# Patient Record
Sex: Female | Born: 1960 | Race: White | Hispanic: No | Marital: Single | Smoking: Former smoker
Health system: Southern US, Community
[De-identification: ages and names within clinical notes are randomized; demographics above are authoritative.]

## PROBLEM LIST (undated history)

## (undated) DIAGNOSIS — Z460 Encounter for fitting and adjustment of spectacles and contact lenses: Secondary | ICD-10-CM

## (undated) HISTORY — PX: CRANIOTOMY FOR TUMOR: SUR345

## (undated) HISTORY — PX: DILATION AND CURETTAGE OF UTERUS: SHX78

## (undated) HISTORY — PX: COLONOSCOPY: SHX174

---

## 2004-07-02 ENCOUNTER — Other Ambulatory Visit: Admission: RE | Admit: 2004-07-02 | Discharge: 2004-07-02 | Payer: Self-pay | Admitting: Obstetrics and Gynecology

## 2004-07-31 ENCOUNTER — Encounter: Admission: RE | Admit: 2004-07-31 | Discharge: 2004-07-31 | Payer: Self-pay | Admitting: Obstetrics and Gynecology

## 2004-11-03 ENCOUNTER — Emergency Department (HOSPITAL_COMMUNITY): Admission: EM | Admit: 2004-11-03 | Discharge: 2004-11-03 | Payer: Self-pay | Admitting: Family Medicine

## 2005-08-03 ENCOUNTER — Encounter: Admission: RE | Admit: 2005-08-03 | Discharge: 2005-08-03 | Payer: Self-pay | Admitting: Obstetrics and Gynecology

## 2006-04-13 ENCOUNTER — Encounter: Admission: RE | Admit: 2006-04-13 | Discharge: 2006-04-13 | Payer: Self-pay | Admitting: *Deleted

## 2006-08-10 ENCOUNTER — Encounter: Admission: RE | Admit: 2006-08-10 | Discharge: 2006-08-10 | Payer: Self-pay | Admitting: Obstetrics and Gynecology

## 2007-10-11 ENCOUNTER — Encounter: Admission: RE | Admit: 2007-10-11 | Discharge: 2007-10-11 | Payer: Self-pay | Admitting: Obstetrics and Gynecology

## 2008-03-15 ENCOUNTER — Ambulatory Visit: Payer: Self-pay | Admitting: Sports Medicine

## 2008-03-15 DIAGNOSIS — R002 Palpitations: Secondary | ICD-10-CM | POA: Insufficient documentation

## 2008-03-15 DIAGNOSIS — M21619 Bunion of unspecified foot: Secondary | ICD-10-CM | POA: Insufficient documentation

## 2008-03-15 DIAGNOSIS — M25569 Pain in unspecified knee: Secondary | ICD-10-CM | POA: Insufficient documentation

## 2008-03-15 DIAGNOSIS — M216X9 Other acquired deformities of unspecified foot: Secondary | ICD-10-CM | POA: Insufficient documentation

## 2008-11-06 ENCOUNTER — Encounter: Admission: RE | Admit: 2008-11-06 | Discharge: 2008-11-06 | Payer: Self-pay | Admitting: Obstetrics and Gynecology

## 2010-01-27 ENCOUNTER — Encounter: Admission: RE | Admit: 2010-01-27 | Discharge: 2010-01-27 | Payer: Self-pay | Admitting: Obstetrics and Gynecology

## 2010-10-02 ENCOUNTER — Other Ambulatory Visit: Payer: Self-pay | Admitting: Family Medicine

## 2010-10-02 NOTE — Telephone Encounter (Signed)
Dr.Knapp, Refill request in the pool. Patient never seen here, no appointment scheduled.

## 2011-01-02 ENCOUNTER — Other Ambulatory Visit: Payer: Self-pay | Admitting: Obstetrics and Gynecology

## 2011-01-02 DIAGNOSIS — Z1231 Encounter for screening mammogram for malignant neoplasm of breast: Secondary | ICD-10-CM

## 2011-02-10 ENCOUNTER — Ambulatory Visit: Payer: Self-pay

## 2011-02-16 ENCOUNTER — Ambulatory Visit: Payer: Self-pay

## 2011-02-26 ENCOUNTER — Ambulatory Visit
Admission: RE | Admit: 2011-02-26 | Discharge: 2011-02-26 | Disposition: A | Discharge status: Home / Self Care | Payer: BC Managed Care – PPO | Source: Ambulatory Visit | Attending: Obstetrics and Gynecology | Admitting: Obstetrics and Gynecology

## 2011-02-26 DIAGNOSIS — Z1231 Encounter for screening mammogram for malignant neoplasm of breast: Secondary | ICD-10-CM

## 2012-01-27 ENCOUNTER — Other Ambulatory Visit: Payer: Self-pay | Admitting: Obstetrics and Gynecology

## 2012-01-27 DIAGNOSIS — Z1231 Encounter for screening mammogram for malignant neoplasm of breast: Secondary | ICD-10-CM

## 2012-03-15 ENCOUNTER — Ambulatory Visit
Admission: RE | Admit: 2012-03-15 | Discharge: 2012-03-15 | Disposition: A | Discharge status: Home / Self Care | Payer: BC Managed Care – PPO | Source: Ambulatory Visit | Attending: Obstetrics and Gynecology | Admitting: Obstetrics and Gynecology

## 2012-03-15 DIAGNOSIS — Z1231 Encounter for screening mammogram for malignant neoplasm of breast: Secondary | ICD-10-CM

## 2012-05-17 ENCOUNTER — Ambulatory Visit (INDEPENDENT_AMBULATORY_CARE_PROVIDER_SITE_OTHER): Payer: BC Managed Care – PPO | Admitting: Sports Medicine

## 2012-05-17 ENCOUNTER — Encounter: Payer: Self-pay | Admitting: Sports Medicine

## 2012-05-17 VITALS — BP 117/82 | HR 67 | Ht 68.0 in | Wt 190.0 lb

## 2012-05-17 DIAGNOSIS — M7710 Lateral epicondylitis, unspecified elbow: Secondary | ICD-10-CM

## 2012-05-17 DIAGNOSIS — M25562 Pain in left knee: Secondary | ICD-10-CM | POA: Insufficient documentation

## 2012-05-17 DIAGNOSIS — M25521 Pain in right elbow: Secondary | ICD-10-CM | POA: Insufficient documentation

## 2012-05-17 DIAGNOSIS — M7711 Lateral epicondylitis, right elbow: Secondary | ICD-10-CM | POA: Insufficient documentation

## 2012-05-17 DIAGNOSIS — M25529 Pain in unspecified elbow: Secondary | ICD-10-CM

## 2012-05-17 DIAGNOSIS — M25569 Pain in unspecified knee: Secondary | ICD-10-CM

## 2012-05-17 NOTE — Progress Notes (Signed)
  Subjective:    Patient ID: Toni Thompson, female    DOB: 1961/01/11, 52 y.o.   MRN: [id number]  HPI  52 year old female presents with pain RT elbow and RT shoulder. She says 2.5 weeks ago during practice, she was practicing slicing ball. Last week, she developed RT elbow and shoulder pain while playing with her friends. She was unable to play anymore due to pain. She plays tennis 3 days per week, mostly on weekends. Pain is located lateral aspect of elbow and anterior shoulder. Has not played in 2 weeks and is very frustrated.  Last week, she went to [facility] for this issue- was given Prednisone pack (12 day taper), topical analgesic, and Mobic. She denies any weakness of RT UE, numbness or tingling.  Prednisone did lessen the pain.  Of note, patient also has torn LT lateral meniscus which was an incidental finding.  Confirmed on MRI Denies any knee pain or complaints.  Review of Systems Per HPI    Objective:   Physical Exam General: pleasant, in no acute distress  RT Shoulder: Inspection reveals no abnormalities, atrophy or asymmetry. Palpation is normal with no tenderness over AC joint or bicipital groove. ROM is full in all planes. Rotator cuff strength normal throughout. No signs of impingement with negative Hawkin's tests, empty can.  RT elbow:  Full flexion and extension. Full pronation and supination with mild pain of lateral epicondyle. Mild tenderness on palpation of lateral epicondyle. Mild tenderness with resistance of wrist extensors.  US Findings: LT elbow - hypoechoic area suggesting fluid and inflammation surrounding lateral epicondyle with incidental bone spur.  LT shoulder: Rotator cuff tendons were unremarkable.  Biceps tendon unremarkable.    Note scan of left lateral meniscus does reveal a horizontal tear with mild hypoechoic change around joint line    Assessment & Plan:

## 2012-05-17 NOTE — Assessment & Plan Note (Signed)
Will use compression sleeve  HEP

## 2012-05-17 NOTE — Patient Instructions (Addendum)
You have "tennis elbow" or lateral epicondylitis. Please wear compression sleeve on elbow and knee while playing tennis. Use the topical cream and Mobic as needed for pain. Schedule follow up appointment with Dr. Darrick Penna in 4-6 weeks or sooner as needed.

## 2012-05-17 NOTE — Assessment & Plan Note (Addendum)
Secondary to repetitive slicing during tennis lessons/games.  [place] did show inflammation and fluid surrounding lateral epicondyle.  Will give patient compression sleeve to be worn during tennis.  HEP for tennis elbow discussed.  Mobic and Voltaren gel as needed for pain.  Recheck in 4-6 weeks or sooner as needed.  Hx of migraine aura - will not use NTG

## 2012-05-17 NOTE — Assessment & Plan Note (Signed)
She clearly has a split menisucs  I suggested using compression sleeve  HEP given

## 2012-05-25 ENCOUNTER — Ambulatory Visit: Payer: BC Managed Care – PPO | Admitting: Sports Medicine

## 2012-06-22 ENCOUNTER — Encounter: Payer: Self-pay | Admitting: Sports Medicine

## 2012-06-22 ENCOUNTER — Ambulatory Visit (INDEPENDENT_AMBULATORY_CARE_PROVIDER_SITE_OTHER): Payer: BC Managed Care – PPO | Admitting: Sports Medicine

## 2012-06-22 VITALS — BP 108/73 | Ht 69.0 in | Wt 180.0 lb

## 2012-06-22 DIAGNOSIS — M25569 Pain in unspecified knee: Secondary | ICD-10-CM

## 2012-06-22 DIAGNOSIS — M7711 Lateral epicondylitis, right elbow: Secondary | ICD-10-CM

## 2012-06-22 DIAGNOSIS — M25562 Pain in left knee: Secondary | ICD-10-CM

## 2012-06-22 DIAGNOSIS — M7710 Lateral epicondylitis, unspecified elbow: Secondary | ICD-10-CM

## 2012-06-22 MED ORDER — NITROGLYCERIN 0.2 MG/HR TD PT24: MEDICATED_PATCH | TRANSDERMAL | Status: DC

## 2012-06-22 NOTE — Assessment & Plan Note (Signed)
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 Patient is having minimal pain on the lateral joint line but a negative McMurray's. Patient will be sent to formal physical therapy for this as well but I think this is more important for avoiding recurrence. Patient will follow up on an as-needed basis for this problem.

## 2012-06-22 NOTE — Progress Notes (Signed)
Patient is here for f/u of right elbow and left knee   Right elbow has made minimal improvement. Patient continues to take the meloxciam, pennsaid and compression with mild improvement as well.  Patient though does notice more pain going down the elbow as well and has to stop during the tennis match.  Patient denies any nighttime awakenings at this time.  No other new symptoms.   Left knee is doing much better, no pain, continue to wear compression with exercise, no swelling, able to do all activities.  Patient though still states it is not quite the same as he other knee.   Past medical history, social, surgical and family history all reviewed.   Physical Exam Blood pressure 108/73, height 5\' 9"  (1.753 m), weight 180 lb (81.647 kg). General: No apparent distress alert and oriented x3 mood and affect normal Respiratory: Patient's speak in full sentences and does not appear short of breath Skin: Warm dry intact with no signs of infection or rash Neuro: Cranial nerves II through XII are intact, neurovascularly intact in all extremities with 2+ DTRs and 2+ pulses. Right elbow:  Pain with palpation over lateral epicodylitis and mid forearm over PIN. + pain with wrist extension and extension of third finger.  Left knee exam: On inspection there is no gross deformity or effusion. Patient has full range of motion. She is minimally tender over the lateral joint line. Patient has a negative McMurray's as well as a negative Thessaly.

## 2012-06-22 NOTE — Patient Instructions (Addendum)
Good to see you I am going to send you to physical therapy to help your elbow.  They will be calling you in the next day or two.  Call us on Friday if you have not heard from anyone. Continue your exercises as well on a regular basis.  We will keep injections in our back pocket but we would like to avoid it due to poor long term outcome.  We will try the nitro patch.  Try a 1/4 patch to the elbow daily but if it gives you an aura stop the medication.  Icing will still be your best friend as well.  Come back and see [place] again in 6 weeks. We will re scan you at that time and make sure you are improving.

## 2012-06-22 NOTE — Assessment & Plan Note (Signed)
Patient continues to have problems but it's not doing exercises on a regular basis. Encourage her to do this daily. We will get patient into formal physical therapy as well. She'll continue with the compression. At this point to we will actually have patient try to nitroglycerin patch. Patient states that her migraines or more of an aura and no true pain. Patient knows that this increase his frequency to stop the nitroglycerin patch immediately. Patient will try these interventions and come back again in 4-6 weeks for further evaluation.

## 2012-07-27 ENCOUNTER — Ambulatory Visit (INDEPENDENT_AMBULATORY_CARE_PROVIDER_SITE_OTHER): Payer: BC Managed Care – PPO | Admitting: Sports Medicine

## 2012-07-27 VITALS — BP 116/79 | Ht 69.0 in | Wt 180.0 lb

## 2012-07-27 DIAGNOSIS — M7711 Lateral epicondylitis, right elbow: Secondary | ICD-10-CM

## 2012-07-27 DIAGNOSIS — M25569 Pain in unspecified knee: Secondary | ICD-10-CM

## 2012-07-27 DIAGNOSIS — L259 Unspecified contact dermatitis, unspecified cause: Secondary | ICD-10-CM | POA: Insufficient documentation

## 2012-07-27 DIAGNOSIS — M25562 Pain in left knee: Secondary | ICD-10-CM

## 2012-07-27 DIAGNOSIS — M7710 Lateral epicondylitis, unspecified elbow: Secondary | ICD-10-CM

## 2012-07-27 MED ORDER — TRIAMCINOLONE ACETONIDE 0.5 % EX CREA: TOPICAL_CREAM | Freq: Two times a day (BID) | CUTANEOUS | Status: DC

## 2012-07-27 MED ORDER — HYDROXYZINE HCL 25 MG PO TABS: 25.0000 mg | ORAL_TABLET | Freq: Three times a day (TID) | ORAL | Status: DC | PRN

## 2012-07-27 MED ORDER — MELOXICAM 7.5 MG PO TABS: 7.5000 mg | ORAL_TABLET | Freq: Every day | ORAL | Status: DC | PRN

## 2012-07-27 NOTE — Patient Instructions (Addendum)
I am sorry about the rash.  It likely is from the body helix or from the sweating underneath it. You can try the new brace we are giving you today and see if it helps. Triamcinolone cream 2 times daily Hydroxyzine for the itching.  Can take it 3 times a day but may make you a little sleepy.  Continue to go to Physical therapy as well.  I think it is really beneficial.  Continue home exercises at least 3 times a week.  Potentially start working on strength of the right shoulder as well.  The flex bar is a good exercise as well.  The knee looks like it is doing really good.  Keep it up!

## 2012-07-27 NOTE — Progress Notes (Signed)
CC: Rash on right upper arm  HPI: 10 days rash of the right elbow. Patient has been wearing the body helix, she's been doing I am diaphoresis in physical therapy, has been putting on topical medication that she got from [facility] says she does not know what caused it. Patient denies any new lotions perfumes. Patient denies any new detergent. Patient has been washing her body helix a regular basis. Patient denies any fevers or chills denies any pain does have significant of itchiness in the area.  No bug bites.   Regarding her right lateral epicondylitis she is approximately 60-70% better than last visit. She has been doing very well and only has pain with her forehand while playing tennis. Patient also notices that she seems to fatigue in her serve seems to worsen throughout the first set.  No new symptoms, has not been doing the nitro patch at this time.   Patient's left knee pain with questionable meniscal injury seems to be improving significantly. Patient is having minimal pain only with stopping and twisting motions. Overall she is able do all activities of daily living. Patient denies any swelling, denies any radiation or any numbness of the lower extremity.  Physical exam Blood pressure 116/79, height 5\' 9"  (1.753 m), weight 180 lb (81.647 kg). General: No apparent distress alert and oriented x3 Right upper extremity: On inspection patient does have a macular slightly papular rash with mild will formation that appears to be contracted dermatitis in the corresponding area where a body helix to be worn. She is neurovascularly intact distally. No true effusion noted. Patient does have full range of the right elbow. She is still tender to palpation over the lateral epicondylitis region. Patient though does have improved strength with resisted extension of the wrist. Patient does have a mild positive speed and Yergason's.  Left knee exam: On inspection no gross deformity. Patient has full  range of motion and is nontender to palpation. All ligaments appear to be intact and a improved McMurray's.  Musculoskeletal ultrasound was performed and interpreted by me today. Patient's ultrasound of the right upper extremity does show that patient has healing of the lateral epicondylitis with decreasing hypoechoic changes as well as reabsorption of the bone spur that was seen previously. Patient does still have neovascularization in the area. Note true tear appreciated. In addition to this patient bicep tendon was visualized with no hypoechoic changes and no tearing. Patient does have a very shallow bicipital

## 2012-07-27 NOTE — Assessment & Plan Note (Signed)
Will do triamcinolone and hydroxizine for symptom relief. Stop the body helix for now.  Likely more due to the sweating as well. Discuss toweling off every 30 minutes if start wearing again.

## 2012-07-27 NOTE — Assessment & Plan Note (Signed)
Appears to be well healed at this time. Discussed continue eccentrics and some plyo but still avoid cutting too much.

## 2012-07-27 NOTE — Assessment & Plan Note (Signed)
Will attempt a new brace to see if it will help Appears patient is doing better as well.  Discussed some routine tennis stroke exercises and technique.  Continue PT and HEP.  Will have her come back again in 6 weeks.

## 2012-09-07 ENCOUNTER — Encounter: Payer: Self-pay | Admitting: Sports Medicine

## 2012-09-07 ENCOUNTER — Ambulatory Visit (INDEPENDENT_AMBULATORY_CARE_PROVIDER_SITE_OTHER): Payer: BC Managed Care – PPO | Admitting: Sports Medicine

## 2012-09-07 VITALS — BP 111/73 | Ht 69.0 in | Wt 180.0 lb

## 2012-09-07 DIAGNOSIS — M21619 Bunion of unspecified foot: Secondary | ICD-10-CM

## 2012-09-07 DIAGNOSIS — M2010 Hallux valgus (acquired), unspecified foot: Secondary | ICD-10-CM

## 2012-09-07 DIAGNOSIS — M7711 Lateral epicondylitis, right elbow: Secondary | ICD-10-CM

## 2012-09-07 DIAGNOSIS — M25569 Pain in unspecified knee: Secondary | ICD-10-CM

## 2012-09-07 DIAGNOSIS — M2021 Hallux rigidus, right foot: Secondary | ICD-10-CM

## 2012-09-07 DIAGNOSIS — M2020 Hallux rigidus, unspecified foot: Secondary | ICD-10-CM

## 2012-09-07 DIAGNOSIS — M7710 Lateral epicondylitis, unspecified elbow: Secondary | ICD-10-CM

## 2012-09-07 DIAGNOSIS — M25562 Pain in left knee: Secondary | ICD-10-CM

## 2012-09-07 NOTE — Progress Notes (Signed)
CC:  F/u right elbow pain, and new problem of right great toe pain  HPI: Patient presents for f/u of right elbow pain. States pain is unchanged from last visit, but improved from initial presentation. She is using the body helix compression sleeve and feels it helps. Rash resolved and no further problems with sleeve. Patient thinks the rash was from the cream she put under the sleeve and not the sleeve itself. Forearm pain still bothers her every day, especially on volley and slice. She is still playing tennis. She also has discomfort at work when using the computer  Also complains of right great toe pain. Toe is chronically stiff, but more inflamed during the spring after playing a lot of tennis nearly daily for 3 weeks. Saw [facility] for inserts, but this has not really helped much. She thinks.  ROS: As above in the HPI. All other systems are stable or negative.  OBJECTIVE: APPEARANCE:  Patient in no acute distress.The patient appeared well nourished and normally developed. HEENT: No scleral icterus. Conjunctiva non-injected MSK:  1. Right arm: No obvious swelling or deformity. Minimal TTP over lateral epicondyle. Mild-moderate TTP on palpation of body of extensor carpi radialis. 5/5 strength with moderate discomfort on wrist extension and lateral deviation against resistance. 2. Right great toe: There is obvious bony enlargement of right MTP joint. TTP on joint line. Bony overgrowth present. Bony hallux rigidus present with limitations in flexion and extension of great toe  Bunion of left great toe  MSK U/S: 1. Right lat epicondyle: Normal tendon fiber alignment of common extensor tendon at lateral epicondyle. No hypoechogenicity or tears present. Mild peripheral neovascularization near the musculotendinous junction. 2. Right MTP joint: There is bony overgrowth and bone spurring present with increased fluid around joint capsule. There is joint space narrowing present.  ASSESSMENT  and PLAN: 1. Right forearm pain. Lateral epicondylitis appears nearly resolved at this time. Pain seems to be more consistent with location within the extensor muscle bodies, especially extensor carpi radialis. Suspect muscle strain/overuse. - Decrease string tension on racquet - Use light weight racquet. - Increase grip to 4 and 3/8 inch - Continue home wrist extension and supination exercises  2. Bony hallux rigidus - rubber based orthotic modified today to include scaphoid pad in arch as well as 1st metatarsal ray - Use ice to MTP joint, especially after tennis - Try topical NSAID and capsaicin.  She felt less toe pain after walking in these

## 2012-09-07 NOTE — Assessment & Plan Note (Signed)
See note - this has steadily improved Keep up exercises  maek changes to racquet as noted  Reck prn

## 2012-09-07 NOTE — Assessment & Plan Note (Signed)
Stable with this at present

## 2012-09-07 NOTE — Patient Instructions (Signed)
1. Forearm pain - Reduce string tension - Try increased grip size to 4 and 3/8 - Continue forearm home exercises  2. Great toe pain - Try modified insoles - Mobilization in PT unlikely to help due to arthritis and bone spurs - Try topical NSAID and capsaicin - Ice for 20 min 2-3 x per day, especially after activity

## 2012-09-07 NOTE — Assessment & Plan Note (Signed)
Associated with significant hallux rigidus  We will try modifying current orthotics  If that works well continue with this  If not consider new customized pair with first ray and long arch support

## 2012-10-25 ENCOUNTER — Encounter: Payer: Self-pay | Admitting: *Deleted

## 2013-01-05 ENCOUNTER — Other Ambulatory Visit: Payer: Self-pay

## 2013-01-10 ENCOUNTER — Encounter (HOSPITAL_BASED_OUTPATIENT_CLINIC_OR_DEPARTMENT_OTHER): Payer: Self-pay | Admitting: *Deleted

## 2013-01-10 NOTE — Progress Notes (Signed)
No labs needed

## 2013-01-11 ENCOUNTER — Other Ambulatory Visit: Payer: Self-pay | Admitting: Orthopedic Surgery

## 2013-01-11 NOTE — H&P (Signed)
Toni Thompson/[facility] [address]   [address] [place], [place] [address] [phone] A Division of [facility]  Toni Thompson, M.D.   Toni Thompson, M.D.   Toni Thompson, M.D.   Toni Thompson, M.D.   Toni Thompson, M.D Toni Thompson. Toni Thompson, M.D.  Toni Thompson, M.D.  Toni Thompson, M.D.  Toni Thompson, M.D.   Toni Thompson, M.D. Toni Thompson. Toni Rough, PA-C  Toni A. Shepperson, PA-C  Toni Hilton, PA-C [place], [place]   RE: Toni, Thompson   [id number]      DOB: 13-Dec-1960 PROGRESS NOTE: 12-27-12 Toni Thompson is seen for a second opinion and treatment recommendation for issues with her  right foot great toe.  Good historian and I have some of her old records but I do not have them in their entirety.    A long history of hallus rigidus, loss of motion, degenerative arthritis, first MTP joint right foot great toe.  Seen and evaluated in the past by Dr. Lestine Box.  Also seen more recently by Dr. Victorino Dike.  She is relating to me that she has been told to just live with her symptoms and try to put off any operative intervention.  Unfortunately in trying to do this and in trying to remain active playing tennis regularly, it has become more and more of a problem.  She now has symptoms with activities of daily living.  This has altered her gait and she has developed a stress fracture of her fifth metatarsal on that same foot confirmed by MRI scan by Dr. Farris Has.  She presents now to discuss what can be done while she is taking time off from tennis and playing to help with the primary problem of her great toe.  No previous operative intervention.    Toni Thompson then went through a long diatribe with me about all of the things that have happened since she turned 51.  She reiterated time and time again that her only goal is to continue to play tennis on a regular basis numerous times per week on hard and soft courts playing singles as much as  doubles.  In the past, she has been active in working out, maintaining her weight, maintaining her conditioning but she completely stopped working out with a Systems analyst.  She has put on 25 pounds and in addition to that has continued to try to play tennis at an aggressive level.  During all of this, she has developed symptoms in her left leg with an MRI confirmation done at [facility] of some progressive degenerative arthritis, moderate at least, patellofemoral joint lateral compartment and complex tearing of her lateral meniscus.  She has also developed pronounced popping and irritation lateral aspect of her hip over the greater trochanter iliotibial band.    I have looked at the MRI report that was done of her knee that she had on her phone confirming what is described above.  She reiterated to me numerous times that nobody ever told her to stop playing tennis, and as best she can recall that nothing needed to be done to help her knee or her hip until she decided they were Thompson enough to require treatment.  She has relatively pronounced mechanical symptoms lateral hip from popping the IT band as well as intraarticular mechanical symptoms in her knee but again they have not stopped her for continuing to play tennis.  It is the issue with her right foot that is  causing her to stop.    The remaining history was reviewed, updated and included in the chart.    EXAMINATION: General exam is outlined and included in the chart.  Specifically, 52 year old female, 5'9", 200 pounds.  She has a relatively antalgic gait on the right as a result of los of motion of the first MP joint as well as antalgic gait from the stress fracture on the lateral side of her foot.  On upper extremity exam, she has relatively good motion, strength and function but although she did not mention it previously, she has findings of at least moderate lateral epicondylitis in the right elbow.  No instability.  Lower extremity  exam: she has snapping iliotibial band greater trochanteric bursitis   Continued----  Toni Thompson/[facility] [address]   [address] [place], [place] [address] [phone] A Division of [facility]  Toni Thompson, M.D.   Toni Thompson, M.D.   Toni Thompson, M.D.   Toni Thompson, M.D.   Toni Thompson, M.D Toni Thompson. Toni Thompson, M.D.  Toni Thompson, M.D.  Toni Thompson, M.D.  Toni Thompson, M.D.   Toni Thompson, M.D. Toni Thompson. Toni Rough, PA-C  Toni A. Shepperson, PA-C  Toni Salinas, PA-C [place], [place]   RE: Toni, Thompson   [id number]      DOB: 21-May-1960 PROGRESS NOTE: 12-27-12 Page 2   on the left.  Negative log roll both sides.  Negative straight leg raise both sides.  Left knee point tenderness laterally, positive McMurray's, some grating crepitus patellofemoral, tibiofemoral not extreme, stable ligaments.  Right foot is very tender over the fifth metatarsal.  Her first MP joint has dorsal spurring and she has dorsiflexion of maybe 15 degrees, nothing more.  Some crepitus throughout most of the joint.  No varus or valgus deformity.  No deformity of the midfoot or lesser toes.    X-RAYS: I reviewed her previous x-rays and I got a good lateral of her first MP joint right foot.  She has extensive dorsal spurring from hallus rigidus.  She has more than 50% narrowing of the entire joint but not bone on bone.  Although she has a stress fracture by MRI, I am not seeing that on plain films.    DISPOSITION: I have had a long extensive discussion of more than 45 minutes with Toni Thompson concerning her dilemma.  The reality of numerous overuse and traumatic injuries that are occurring as a result of her activity and lack of preparation of activity are extremely obvious, but despite a thorough discussion of this she keeps returning to the fact that she must play tennis regularly and at this point in time has no desire to do  any other activities in terms of training and preparation for that event.  She also does not appear to be very interested in any cross-training or other conditioning events.    1. In regards to her foot, I think a cheilectomy is reasonable.  I have emphasized to her that there are extremes of degenerative changes in the first MP joint and a cheilectomy will help with motion, help with function, buy time, but at the end of the day she could have progressive arthritis and could come to an arthrodesis in the future. I think based on my findings it is definitely worth proceeding with that procedure from what we can gain.  I have told her what to expect inter and postoperatively.  I have also  emphasized to her that she is going to be in a Cam walker for a full six weeks Thompson-op to allow healing of her 5th metatarsal stress fracture.  I am also noting that she came in today leaving her Cam walker in the car and obviously not using that.  I told her if we do a successful procedure on her first MTP joint and she does not let her stress fracture heal, that is going to be a problem in the future.  Paperwork completed.  All questions are answered.  Risks, benefits, complications and anticipated recover and rehab were reviewed with her.  2. In regards to the numerous other issues of greater trochanteric bursitis, snapping of the iliotibial band, torn lateral meniscus, degenerative arthritis, and tennis elbow she needs somewhat of a reality check.  I   Continued---  Toni Thompson/[facility] [address]   [address] [place], [place] [address] [phone] A Division of [facility]  Toni Thompson, M.D.   Toni Thompson, M.D.   Toni Thompson, M.D.   Toni Thompson, M.D.   Toni Thompson, M.D Toni Thompson. Toni Thompson, M.D.  Toni Thompson, M.D.  Toni Thompson, M.D.  Toni Thompson, M.D.   Toni Thompson, M.D. Toni Thompson. Toni Rough, PA-C  Toni A. Shepperson,  PA-C  Toni Walters, PA-C [place], [place]   RE: Toni, Thompson   [id number]      DOB: [birth year] PROGRESS NOTE: 12-27-12 Page 3  really tried to do this today and I don't know if we accomplished much.  If she is interested, I can take this up on future visits but I am just not sure that she is interested in anything other than just playing tennis as much as she can until her body will not let her do it anymore.  I think we will have to wait and see how she does after her foot.     Toni Thompson, M.D.  Electronically verified by Toni Thompson, M.D. DFM:gde D 12-27-12 T 12-28-12 Cc:  Toni Arthurs, MD fax [phone]

## 2013-01-12 ENCOUNTER — Ambulatory Visit (HOSPITAL_BASED_OUTPATIENT_CLINIC_OR_DEPARTMENT_OTHER): Payer: BC Managed Care – PPO | Admitting: Anesthesiology

## 2013-01-12 ENCOUNTER — Encounter (HOSPITAL_BASED_OUTPATIENT_CLINIC_OR_DEPARTMENT_OTHER)
Admission: RE | Disposition: A | Discharge status: Home / Self Care | Payer: Self-pay | Source: Ambulatory Visit | Attending: Orthopedic Surgery

## 2013-01-12 ENCOUNTER — Encounter (HOSPITAL_BASED_OUTPATIENT_CLINIC_OR_DEPARTMENT_OTHER): Payer: Self-pay | Admitting: Anesthesiology

## 2013-01-12 ENCOUNTER — Encounter (HOSPITAL_BASED_OUTPATIENT_CLINIC_OR_DEPARTMENT_OTHER): Payer: BC Managed Care – PPO | Admitting: Anesthesiology

## 2013-01-12 ENCOUNTER — Ambulatory Visit (HOSPITAL_BASED_OUTPATIENT_CLINIC_OR_DEPARTMENT_OTHER)
Admission: RE | Admit: 2013-01-12 | Discharge: 2013-01-12 | Disposition: A | Discharge status: Home / Self Care | Payer: BC Managed Care – PPO | Source: Ambulatory Visit | Attending: Orthopedic Surgery | Admitting: Orthopedic Surgery

## 2013-01-12 DIAGNOSIS — M2020 Hallux rigidus, unspecified foot: Secondary | ICD-10-CM | POA: Insufficient documentation

## 2013-01-12 DIAGNOSIS — M19079 Primary osteoarthritis, unspecified ankle and foot: Secondary | ICD-10-CM | POA: Insufficient documentation

## 2013-01-12 HISTORY — DX: Encounter for fitting and adjustment of spectacles and contact lenses: Z46.0

## 2013-01-12 HISTORY — PX: CHEILECTOMY: SHX1336

## 2013-01-12 LAB — POCT HEMOGLOBIN-HEMACUE: Hemoglobin: 14.3 g/dL (ref 12.0–15.0)

## 2013-01-12 SURGERY — CHEILECTOMY
Anesthesia: General | Site: Toe | Laterality: Right | Wound class: Clean

## 2013-01-12 MED ORDER — FENTANYL CITRATE 0.05 MG/ML IJ SOLN
50.0000 ug | INTRAMUSCULAR | Status: DC | PRN
  Administered 2013-01-12: 100 ug via INTRAVENOUS

## 2013-01-12 MED ORDER — PROPOFOL 10 MG/ML IV BOLUS
INTRAVENOUS | Status: DC | PRN
  Administered 2013-01-12: 200 mg via INTRAVENOUS

## 2013-01-12 MED ORDER — LACTATED RINGERS IV SOLN
INTRAVENOUS | Status: DC
  Administered 2013-01-12 (×2): via INTRAVENOUS

## 2013-01-12 MED ORDER — MIDAZOLAM HCL 2 MG/2ML IJ SOLN
INTRAMUSCULAR | Status: AC
  Filled 2013-01-12: qty 2

## 2013-01-12 MED ORDER — DEXAMETHASONE SODIUM PHOSPHATE 4 MG/ML IJ SOLN
INTRAMUSCULAR | Status: DC | PRN
  Administered 2013-01-12: 10 mg via INTRAVENOUS

## 2013-01-12 MED ORDER — BUPIVACAINE HCL (PF) 0.25 % IJ SOLN
INTRAMUSCULAR | Status: AC
  Filled 2013-01-12: qty 30

## 2013-01-12 MED ORDER — OXYCODONE-ACETAMINOPHEN 5-325 MG PO TABS: 1.0000 | ORAL_TABLET | ORAL | Status: DC | PRN

## 2013-01-12 MED ORDER — BUPIVACAINE HCL (PF) 0.5 % IJ SOLN
INTRAMUSCULAR | Status: AC
  Filled 2013-01-12: qty 30

## 2013-01-12 MED ORDER — DEXAMETHASONE SODIUM PHOSPHATE 10 MG/ML IJ SOLN
INTRAMUSCULAR | Status: DC | PRN
  Administered 2013-01-12: 6 mg
  Administered 2013-01-12: 10 mg via INTRAVENOUS

## 2013-01-12 MED ORDER — HYDROMORPHONE HCL PF 1 MG/ML IJ SOLN: 0.2500 mg | INTRAMUSCULAR | Status: DC | PRN

## 2013-01-12 MED ORDER — PROMETHAZINE HCL 25 MG/ML IJ SOLN: 6.2500 mg | INTRAMUSCULAR | Status: DC | PRN

## 2013-01-12 MED ORDER — BUPIVACAINE-EPINEPHRINE PF 0.5-1:200000 % IJ SOLN
INTRAMUSCULAR | Status: DC | PRN
  Administered 2013-01-12: 150 mg

## 2013-01-12 MED ORDER — FENTANYL CITRATE 0.05 MG/ML IJ SOLN
INTRAMUSCULAR | Status: AC
  Filled 2013-01-12: qty 2

## 2013-01-12 MED ORDER — MIDAZOLAM HCL 5 MG/5ML IJ SOLN
INTRAMUSCULAR | Status: DC | PRN
  Administered 2013-01-12: 2 mg via INTRAVENOUS

## 2013-01-12 MED ORDER — CEFAZOLIN SODIUM-DEXTROSE 2-3 GM-% IV SOLR
2.0000 g | INTRAVENOUS | Status: AC
  Administered 2013-01-12: 2 g via INTRAVENOUS

## 2013-01-12 MED ORDER — CEFAZOLIN SODIUM-DEXTROSE 2-3 GM-% IV SOLR
INTRAVENOUS | Status: AC
  Filled 2013-01-12: qty 50

## 2013-01-12 MED ORDER — OXYCODONE HCL 5 MG PO TABS: 5.0000 mg | ORAL_TABLET | Freq: Once | ORAL | Status: DC | PRN

## 2013-01-12 MED ORDER — FENTANYL CITRATE 0.05 MG/ML IJ SOLN
INTRAMUSCULAR | Status: DC | PRN
  Administered 2013-01-12: 50 ug via INTRAVENOUS

## 2013-01-12 MED ORDER — FENTANYL CITRATE 0.05 MG/ML IJ SOLN
INTRAMUSCULAR | Status: AC
  Filled 2013-01-12: qty 4

## 2013-01-12 MED ORDER — MIDAZOLAM HCL 2 MG/2ML IJ SOLN
1.0000 mg | INTRAMUSCULAR | Status: DC | PRN
  Administered 2013-01-12: 2 mg via INTRAVENOUS

## 2013-01-12 MED ORDER — OXYCODONE HCL 5 MG/5ML PO SOLN: 5.0000 mg | Freq: Once | ORAL | Status: DC | PRN

## 2013-01-12 MED ORDER — ONDANSETRON HCL 4 MG/2ML IJ SOLN
INTRAMUSCULAR | Status: DC | PRN
  Administered 2013-01-12: 4 mg via INTRAVENOUS

## 2013-01-12 MED ORDER — CHLORHEXIDINE GLUCONATE 4 % EX LIQD: 60.0000 mL | Freq: Once | CUTANEOUS | Status: DC

## 2013-01-12 SURGICAL SUPPLY — 63 items
APL SKNCLS STERI-STRIP NONHPOA (GAUZE/BANDAGES/DRESSINGS)
BANDAGE ELASTIC 4 VELCRO ST LF (GAUZE/BANDAGES/DRESSINGS) IMPLANT
BANDAGE ELASTIC 6 VELCRO ST LF (GAUZE/BANDAGES/DRESSINGS) IMPLANT
BANDAGE ESMARK 6X9 LF (GAUZE/BANDAGES/DRESSINGS) ×1 IMPLANT
BENZOIN TINCTURE PRP APPL 2/3 (GAUZE/BANDAGES/DRESSINGS) IMPLANT
BLADE AVERAGE 25X9 (BLADE) IMPLANT
BLADE OSC/SAG .038X5.5 CUT EDG (BLADE) ×1 IMPLANT
BLADE SURG 15 STRL LF DISP TIS (BLADE) ×1 IMPLANT
BLADE SURG 15 STRL SS (BLADE) ×2
BNDG CMPR 9X6 STRL LF SNTH (GAUZE/BANDAGES/DRESSINGS) ×1
BNDG COHESIVE 4X5 TAN STRL (GAUZE/BANDAGES/DRESSINGS) ×2 IMPLANT
BNDG ESMARK 6X9 LF (GAUZE/BANDAGES/DRESSINGS) ×2
CANISTER SUCT 1200ML W/VALVE (MISCELLANEOUS) IMPLANT
COVER TABLE BACK 60X90 (DRAPES) ×2 IMPLANT
CUFF TOURNIQUET SINGLE 18IN (TOURNIQUET CUFF) ×1 IMPLANT
CUFF TOURNIQUET SINGLE 24IN (TOURNIQUET CUFF) IMPLANT
DECANTER SPIKE VIAL GLASS SM (MISCELLANEOUS) IMPLANT
DRAPE EXTREMITY T 121X128X90 (DRAPE) ×2 IMPLANT
DRAPE OEC MINIVIEW 54X84 (DRAPES) ×1 IMPLANT
DRAPE U 20/CS (DRAPES) ×2 IMPLANT
DRAPE U-SHAPE 47X51 STRL (DRAPES) ×2 IMPLANT
DURAPREP 26ML APPLICATOR (WOUND CARE) ×2 IMPLANT
ELECT NDL TIP 2.8 STRL (NEEDLE) ×1 IMPLANT
ELECT NEEDLE TIP 2.8 STRL (NEEDLE) ×2 IMPLANT
ELECT REM PT RETURN 9FT ADLT (ELECTROSURGICAL) ×2
ELECTRODE REM PT RTRN 9FT ADLT (ELECTROSURGICAL) ×1 IMPLANT
GAUZE XEROFORM 1X8 LF (GAUZE/BANDAGES/DRESSINGS) ×2 IMPLANT
GLOVE BIO SURGEON STRL SZ8 (GLOVE) ×1 IMPLANT
GLOVE BIOGEL M STRL SZ7.5 (GLOVE) ×1 IMPLANT
GLOVE BIOGEL PI IND STRL 8 (GLOVE) IMPLANT
GLOVE BIOGEL PI IND STRL 8.5 (GLOVE) ×1 IMPLANT
GLOVE BIOGEL PI INDICATOR 8 (GLOVE) ×1
GLOVE BIOGEL PI INDICATOR 8.5 (GLOVE)
GLOVE ORTHO TXT STRL SZ7.5 (GLOVE) ×3 IMPLANT
GOWN BRE IMP PREV XXLGXLNG (GOWN DISPOSABLE) ×1 IMPLANT
GOWN PREVENTION PLUS XLARGE (GOWN DISPOSABLE) ×2 IMPLANT
GOWN PREVENTION PLUS XXLARGE (GOWN DISPOSABLE) ×1 IMPLANT
NDL HYPO 25X1 1.5 SAFETY (NEEDLE) IMPLANT
NEEDLE HYPO 25X1 1.5 SAFETY (NEEDLE) IMPLANT
NS IRRIG 1000ML POUR BTL (IV SOLUTION) ×2 IMPLANT
PACK BASIN DAY SURGERY FS (CUSTOM PROCEDURE TRAY) ×2 IMPLANT
PAD CAST 3X4 CTTN HI CHSV (CAST SUPPLIES) IMPLANT
PAD CAST 4YDX4 CTTN HI CHSV (CAST SUPPLIES) ×2 IMPLANT
PADDING CAST COTTON 3X4 STRL (CAST SUPPLIES)
PADDING CAST COTTON 4X4 STRL (CAST SUPPLIES) ×4
PENCIL BUTTON HOLSTER BLD 10FT (ELECTRODE) ×2 IMPLANT
SPONGE GAUZE 4X4 12PLY (GAUZE/BANDAGES/DRESSINGS) ×2 IMPLANT
SPONGE LAP 4X18 X RAY DECT (DISPOSABLE) IMPLANT
STOCKINETTE 4X48 STRL (DRAPES) ×2 IMPLANT
STRIP CLOSURE SKIN 1/2X4 (GAUZE/BANDAGES/DRESSINGS) ×1 IMPLANT
SUCTION FRAZIER TIP 10 FR DISP (SUCTIONS) IMPLANT
SUT ETHIBOND 2 OS 4 DA (SUTURE) IMPLANT
SUT ETHILON 3 0 PS 1 (SUTURE) ×2 IMPLANT
SUT VIC AB 2-0 SH 27 (SUTURE)
SUT VIC AB 2-0 SH 27XBRD (SUTURE) IMPLANT
SUT VIC AB 3-0 SH 27 (SUTURE) ×2
SUT VIC AB 3-0 SH 27X BRD (SUTURE) IMPLANT
SUT VICRYL 4-0 PS2 18IN ABS (SUTURE) IMPLANT
SYR BULB 3OZ (MISCELLANEOUS) ×2 IMPLANT
SYR CONTROL 10ML LL (SYRINGE) ×2 IMPLANT
TUBE CONNECTING 20X1/4 (TUBING) IMPLANT
UNDERPAD 30X30 INCONTINENT (UNDERPADS AND DIAPERS) ×2 IMPLANT
YANKAUER SUCT BULB TIP NO VENT (SUCTIONS) IMPLANT

## 2013-01-12 NOTE — Progress Notes (Signed)
Assisted Dr. Singer with right, ultrasound guided, popliteal/saphenous block. Side rails up, monitors on throughout procedure. See vital signs in flow sheet. Tolerated Procedure well. 

## 2013-01-12 NOTE — Anesthesia Procedure Notes (Addendum)
Anesthesia Regional Block:  Popliteal block  Pre-Anesthetic Checklist: ,, timeout performed, Correct Patient, Correct Site, Correct Laterality, Correct Procedure, Correct Position, site marked, Risks and benefits discussed,  Surgical consent,  Pre-op evaluation,  At surgeon's request and post-op pain management  Laterality: Right  Prep: chloraprep       Needles:  Injection technique: Single-shot  Needle Type: Echogenic Stimulator Needle          Additional Needles:  Procedures: ultrasound guided (picture in chart) and nerve stimulator Popliteal block  Nerve Stimulator or Paresthesia:  Response: plantar flexion, 0.45 mA,   Additional Responses:   Narrative:  Start time: 01/12/2013 8:31 AM End time: 01/12/2013 8:41 AM Injection made incrementally with aspirations every 5 mL.  Performed by: Personally  Anesthesiologist: J. Adonis Huguenin, MD  Additional Notes: A functioning IV was confirmed and monitors were applied.  Sterile prep and drape, hand hygiene and sterile gloves were used.  Negative aspiration and test dose prior to incremental administration of local anesthetic. The patient tolerated the procedure well.Ultrasound  guidance: relevant anatomy identified, needle position confirmed, local anesthetic spread visualized around nerve(s), vascular puncture avoided.  Image printed for medical record.   Popliteal block Procedure Name: LMA Insertion Date/Time: 01/12/2013 9:41 AM Performed by: Caren Macadam Pre-anesthesia Checklist: Patient identified, Emergency Drugs available, Suction available and Patient being monitored Patient Re-evaluated:Patient Re-evaluated prior to inductionOxygen Delivery Method: Circle System Utilized Preoxygenation: Pre-oxygenation with 100% oxygen Intubation Type: IV induction Ventilation: Mask ventilation without difficulty LMA: LMA inserted LMA Size: 4.0 Number of attempts: 1 Airway Equipment and Method: bite block Placement Confirmation:  positive ETCO2 and breath sounds checked- equal and bilateral Tube secured with: Tape Dental Injury: Teeth and Oropharynx as per pre-operative assessment

## 2013-01-12 NOTE — Anesthesia Preprocedure Evaluation (Addendum)
Anesthesia Evaluation  Patient identified by MRN, date of birth, ID band Patient awake    Reviewed: Allergy & Precautions, H&P , NPO status , Patient's Chart, lab work & pertinent test results  Airway       Dental   Pulmonary former smoker,          Cardiovascular negative cardio ROS      Neuro/Psych Hx of Craniotomy for tumor negative psych ROS   GI/Hepatic negative GI ROS, Neg liver ROS,   Endo/Other  negative endocrine ROS  Renal/GU negative Renal ROS  negative genitourinary   Musculoskeletal negative musculoskeletal ROS (+)   Abdominal   Peds negative pediatric ROS (+)  Hematology negative hematology ROS (+)   Anesthesia Other Findings   Reproductive/Obstetrics negative OB ROS                          Anesthesia Physical Anesthesia Plan  ASA: II  Anesthesia Plan: General   Post-op Pain Management:    Induction: Intravenous  Airway Management Planned: LMA  Additional Equipment:   Intra-op Plan:   Post-operative Plan: Extubation in OR  Informed Consent: I have reviewed the patients History and Physical, chart, labs and discussed the procedure including the risks, benefits and alternatives for the proposed anesthesia with the patient or authorized representative who has indicated his/her understanding and acceptance.   Dental advisory given  Plan Discussed with: CRNA, Anesthesiologist and Surgeon  Anesthesia Plan Comments:        Anesthesia Quick Evaluation

## 2013-01-12 NOTE — Anesthesia Postprocedure Evaluation (Signed)
Anesthesia Post Note  Patient: Toni Thompson  Procedure(s) Performed: Procedure(s) (LRB): RIGHT HALLUX RIGIDUS WITH CHEILECTOMY 1ST METATARSOPHALANGEAL (Right)  Anesthesia type: general  Patient location: PACU  Post pain: Pain level controlled  Post assessment: Patient's Cardiovascular Status Stable  Last Vitals:  Filed Vitals:   01/12/13 1100  BP: 109/64  Pulse: 68  Temp:   Resp: 11    Post vital signs: Reviewed and stable  Level of consciousness: sedated  Complications: No apparent anesthesia complications

## 2013-01-12 NOTE — Transfer of Care (Signed)
Immediate Anesthesia Transfer of Care Note  Patient: Toni Thompson  Procedure(s) Performed: Procedure(s): RIGHT HALLUX RIGIDUS WITH CHEILECTOMY 1ST METATARSOPHALANGEAL (Right)  Patient Location: PACU  Anesthesia Type:General and GA combined with regional for post-op pain  Level of Consciousness: awake and alert   Airway & Oxygen Therapy: Patient Spontanous Breathing and Patient connected to face mask oxygen  Post-op Assessment: Report given to PACU RN and Post -op Vital signs reviewed and stable  Post vital signs: Reviewed and stable  Complications: No apparent anesthesia complications

## 2013-01-12 NOTE — Interval H&P Note (Signed)
History and Physical Interval Note:  01/12/2013 7:24 AM  Toni Thompson  has presented today for surgery, with the diagnosis of RIGHT HALLUS RIGIDUS  The various methods of treatment have been discussed with the patient and family. After consideration of risks, benefits and other options for treatment, the patient has consented to  Procedure(s): RIGHT HALLUX RIGIDUS WITH CHEILECTOMY 1ST METATARSOPHALANGEAL (Right) as a surgical intervention .  The patient's history has been reviewed, patient examined, no change in status, stable for surgery.  I have reviewed the patient's chart and labs.  Questions were answered to the patient's satisfaction.     Phala Schraeder F

## 2013-01-13 ENCOUNTER — Encounter (HOSPITAL_BASED_OUTPATIENT_CLINIC_OR_DEPARTMENT_OTHER): Payer: Self-pay | Admitting: Orthopedic Surgery

## 2013-01-13 NOTE — Op Note (Signed)
NAMECATERRA, OSTROFF             ACCOUNT NO.:  [id number]  MEDICAL RECORD NO.:  [id number]  LOCATION:                                 FACILITY:  PHYSICIAN:  Loreta Ave, M.D. DATE OF BIRTH:  18-Feb-1961  DATE OF PROCEDURE:  01/12/2013 DATE OF DISCHARGE:  01/12/2013                              OPERATIVE REPORT   PREOPERATIVE DIAGNOSES: 1. Marked hallux rigidus degenerative arthritis, right foot, first     metacarpophalangeal joint. 2. Mild hallux valgus.  POSTOPERATIVE DIAGNOSES: 1. Marked hallux rigidus degenerative arthritis, right foot, first     metacarpophalangeal joint. 2. Mild hallux valgus.  PROCEDURE:  Cheilectomy, first metacarpophalangeal joint, right foot.  SURGEON:  Loreta Ave, MD  ASSISTANT:  Skip Mayer, PA  ANESTHESIA:  General.  ESTIMATED BLOOD LOSS:  Minimal.  SPECIMENS:  None.  CULTURES:  None.  COMPLICATIONS:  None.  DRESSINGS:  Soft compressive, Unna shoe.  TOURNIQUET TIME:  45 minutes.  DESCRIPTION OF PROCEDURE:  The patient was brought to the operating room, placed on the operating table in supine position.  After adequate anesthesia had been obtained, calf tourniquet applied.  Prepped and draped in usual sterile fashion.  Exsanguinated with elevation of Esmarch.  Tourniquet inflated to 250 mmHg.  Longitudinal incision dorsal aspect, first MP joint, right foot just medial to the extensor tendon. Skin and subcutaneous tissue divided.  Capsule was opened, joint exposed.  Fluoroscopic guidance throughout.  Marked dorsal spurring preventing dorsiflexion past 20 degrees.  All the dorsal spurs were sequentially removed with osteotome and saw and rongeur.  I extended over the medial and lateral sides to take off all spurs as well as a little bit off the base of proximal phalanx.  Joint thoroughly irrigated.  Grade 4 changes on the top of the metatarsal head with grade 3 further down. Confirmed adequate debridement.  90 degrees of  dorsiflexion without difficulty.  Wound irrigated.  Capsule closed with Vicryl.  Skin closed with nylon.  Sterile compressive dressing applied.  Tourniquet deflated and removed.  Anesthesia reversed.  Brought to the recovery room. Tolerated the surgery well.  No complications.     Loreta Ave, M.D.     DFM/MEDQ  D:  01/12/2013  T:  01/13/2013  Job:  [id number]

## 2013-04-25 ENCOUNTER — Other Ambulatory Visit: Payer: Self-pay

## 2013-04-25 DIAGNOSIS — Z1231 Encounter for screening mammogram for malignant neoplasm of breast: Secondary | ICD-10-CM

## 2013-05-16 ENCOUNTER — Ambulatory Visit: Payer: BC Managed Care – PPO

## 2013-05-26 ENCOUNTER — Ambulatory Visit
Admission: RE | Admit: 2013-05-26 | Discharge: 2013-05-26 | Disposition: A | Discharge status: Home / Self Care | Payer: BC Managed Care – PPO | Source: Ambulatory Visit

## 2013-05-26 DIAGNOSIS — Z1231 Encounter for screening mammogram for malignant neoplasm of breast: Secondary | ICD-10-CM

## 2013-11-07 ENCOUNTER — Telehealth: Payer: Self-pay | Admitting: Family Medicine

## 2013-11-07 ENCOUNTER — Ambulatory Visit (INDEPENDENT_AMBULATORY_CARE_PROVIDER_SITE_OTHER): Payer: BC Managed Care – PPO | Admitting: Family Medicine

## 2013-11-07 ENCOUNTER — Ambulatory Visit (INDEPENDENT_AMBULATORY_CARE_PROVIDER_SITE_OTHER)
Admission: RE | Admit: 2013-11-07 | Discharge: 2013-11-07 | Disposition: A | Discharge status: Home / Self Care | Payer: BC Managed Care – PPO | Source: Ambulatory Visit | Attending: Family Medicine | Admitting: Family Medicine

## 2013-11-07 VITALS — BP 108/72 | HR 53 | Ht 69.0 in | Wt 213.0 lb

## 2013-11-07 DIAGNOSIS — M2021 Hallux rigidus, right foot: Secondary | ICD-10-CM

## 2013-11-07 DIAGNOSIS — M545 Low back pain, unspecified: Secondary | ICD-10-CM | POA: Insufficient documentation

## 2013-11-07 DIAGNOSIS — M5416 Radiculopathy, lumbar region: Secondary | ICD-10-CM

## 2013-11-07 DIAGNOSIS — M2020 Hallux rigidus, unspecified foot: Secondary | ICD-10-CM

## 2013-11-07 DIAGNOSIS — IMO0002 Reserved for concepts with insufficient information to code with codable children: Secondary | ICD-10-CM

## 2013-11-07 MED ORDER — DICLOFENAC SODIUM 2 % TD SOLN: TRANSDERMAL | Status: DC

## 2013-11-07 NOTE — Assessment & Plan Note (Signed)
Patient continues to have significant pain in this area. 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 Patient was given a first ray post to her orthotics today. Encourage her to continue to wear the orthotic. We discussed an icing protocol as well as given a prescription for topical medication. We discussed over-the-counter medication as well as other home modalities I could be beneficial. We discussed that continuing to wear good shoes would be the most important. Patient is going to go see a specialist in [place] at the end of the year. We will continue to monitor and see what other changes we can possibly do including potentially injection.

## 2013-11-07 NOTE — Telephone Encounter (Signed)
Spoke to pt, scheduled appt for 4pm today.

## 2013-11-07 NOTE — Patient Instructions (Addendum)
Very nice to meet you Ice 20 minutes 2 times daily. Usually after activity and before bed. Ice bath for the foot.  Exercises 3 times a week. Alternate with back and with hamstrings.  Xrays downstairs.  Take tylenol 650 mg three times a day is the best evidence based medicine we have for arthritis.  Glucosamine sulfate  twice a day is a supplement that has been shown to help moderate to severe arthritis. Vitamin D 2000 IU daily Fish oil 2 grams daily.  Tumeric  twice daily.  Capsaicin topically up to four times a day may also help with pain. It's important that you continue to stay active. Controlling your weight is important.  Water aerobics and cycling with low resistance are the best two types of exercise for arthritis. Come back and see me in 3-4 weeks.
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
## 2013-11-08 ENCOUNTER — Encounter: Payer: Self-pay | Admitting: Family Medicine

## 2013-12-05 ENCOUNTER — Ambulatory Visit (INDEPENDENT_AMBULATORY_CARE_PROVIDER_SITE_OTHER): Payer: BC Managed Care – PPO | Admitting: Family Medicine

## 2013-12-05 ENCOUNTER — Encounter: Payer: Self-pay | Admitting: Family Medicine

## 2013-12-05 VITALS — BP 118/78 | HR 61 | Ht 69.0 in | Wt 213.0 lb

## 2013-12-05 DIAGNOSIS — M545 Low back pain, unspecified: Secondary | ICD-10-CM

## 2013-12-05 DIAGNOSIS — M2021 Hallux rigidus, right foot: Secondary | ICD-10-CM

## 2013-12-05 NOTE — Assessment & Plan Note (Signed)
Discussed with patient at this time that I do feel that rigid sole shoes will be the most beneficial for patient. Patient's going to be fitted for other orthotics I think will be helpful. We discussed an icing regimen as well. We discussed what activities to potentially avoid. I think unfortunately if patient continues to have pain even with this conservative therapy we can always try an intra-articular injection. If this does not make any significant improvement she would have to look at possibly fusion of the joint.

## 2013-12-05 NOTE — Patient Instructions (Addendum)
Good to see you Toni Thompson I am sorry for your foot, we can do injection if pain gets worse.  Ice when you need it.  Continue the pennsaid when you need it.  For your back change the position of your computer screen to eye level Keyboard at 90 degree bend in elbow Tennis ball duct tape to chairs between shoulder blades Bird-Dog exercises lifting the opposite leg and arm.  3 sets of 10 3 times a week Look at handout for the scapula.  On wall heels, butt shoulder and head touching for 5 minutes daily.  See me again in 4 weeks.   Scapular Winging  with Rehab  Scapular winging syndrome is also known as serratus anterior palsy or long thoracic nerve injury. The condition is an uncommon injury to the nervous system. The condition is caused by injury to the long thoracic nerve that runs through the neck and shoulder. Injury to the shoulder, such as a fall or repetitive stress on the shoulder causes the nerve to become stretched. Occasionally the injury is the result of an infection of the nerve. Damage to the long thoracic nerve results in weakness of the serratus anterior muscle. The serratus anterior muscle is responsible for controlling the shoulder blade (scapula). Weakness in this muscle results in a instability (winging) of the scapula. SYMPTOMS   Pain and weakness in the shoulder (usually the back of the shoulder) that is often diffuse or unable to localize.  Loss of or decrease in shoulder function.  Upper back pain while sitting, due to the scapula pressing on the back of the chair.  Visible deformity in the back of the shoulder. CAUSES  Scapular winging is caused by stretching of the long thoracic nerve. Common mechanisms of injury include:  Viral illness.  Repetitive and/or stressful use of the shoulder.  Falling onto the shoulder with the head and neck stretched away from the shoulder. RISK INCREASES WITH:  Contact sports (football, rugby, lacrosse, or soccer).  Activities  involving overhead arm movement (baseball, volleyball, or racquet sports).  Poor strength and flexibility. PREVENTION  Warm up and stretch properly before activity.  Allow for adequate recovery between workouts.  Maintain physical fitness:  Strength, flexibility, and endurance.  Cardiovascular fitness.  Learn and use proper technique. When possible, have a coach correct improper technique. PROGNOSIS  Scapular winging normally resolves spontaneously within 18 months. In rare circumstances surgery is recommended.  RELATED COMPLICATIONS   Permanent nerve damage, including pain, numbness, tingle, or weakness.  Shoulder weakness.  Recurrent shoulder pain.  Inability to compete in athletics. TREATMENT Treatment initially involves resting from any activities that aggravate your symptoms. The use of ice and medication may help reduce pain and inflammation. The use of strengthening and stretching exercises may help reduce pain with activity, specifically shoulder exercises that improve range of motion. These exercises may be performed at home or with referral to a therapist. If symptoms persist for greater than 6 months despite non-surgical (conservative) treatment, then surgery may be recommended. Surgery is only used for the most serious cases and the purpose is to regain function, not to allow an athlete to return to sports. MEDICATION   If pain medication is necessary, then nonsteroidal anti-inflammatory medications, such as aspirin and ibuprofen, or other minor pain relievers, such as acetaminophen, are often recommended.  Do not take pain medication for 7 days before surgery.  Prescription pain relievers may be given if deemed necessary by your caregiver. Use only as directed and only as  much as you need. HEAT AND COLD  Cold treatment (icing) relieves pain and reduces inflammation. Cold treatment should be applied for 10 to 15 minutes every 2 to 3 hours for inflammation and pain  and immediately after any activity that aggravates your symptoms. Use ice packs or massage the area with a piece of ice (ice massage).  Heat treatment may be used prior to performing the stretching and strengthening activities prescribed by your caregiver, physical therapist, or athletic trainer. Use a heat pack or soak the injury in warm water. SEEK MEDICAL CARE IF:  Treatment seems to offer no benefit, or the condition worsens.  Any medications produce adverse side effects. EXERCISES  RANGE OF MOTION (ROM) AND STRETCHING EXERCISES - Scapular Winging (Serratus Anterior Palsy, Long Thoracic Nerve Injury)  These exercises may help you when beginning to rehabilitate your injury. Your symptoms may resolve with or without further involvement from your physician, physical therapist or athletic trainer. While completing these exercises, remember:   Restoring tissue flexibility helps normal motion to return to the joints. This allows healthier, less painful movement and activity.  An effective stretch should be held for at least 30 seconds.  A stretch should never be painful. You should only feel a gentle lengthening or release in the stretched tissue. ROM - Pendulum  Bend at the waist so that your right / left arm falls away from your body. Support yourself with your opposite hand on a solid surface, such as a table or a countertop.  Your right / left arm should be perpendicular to the ground. If it is not perpendicular, you need to lean over farther. Relax the muscles in your right / left arm and shoulder as much as possible.  Gently sway your hips and trunk so they move your right / left arm without any use of your right / left shoulder muscles.  Progress your movements so that your right / left arm moves side to side, then forward and backward, and finally, both clockwise and counterclockwise.  Complete __________ repetitions in each direction. Many people use this exercise to relieve  discomfort in their shoulder as well as to gain range of motion. Repeat __________ times. Complete this exercise __________ times per day. STRETCH - Flexion, Seated   Sit in a firm chair so that your right / left forearm can rest on a table or on a table or countertop. Your right / left elbow should rest below the height of your shoulder so that your shoulder feels supported and not tense or uncomfortable.  Keeping your right / left shoulder relaxed, lean forward at your waist, allowing your right / left hand to slide forward. Bend forward until you feel a moderate stretch in your shoulder, but before you feel an increase in your pain.  Hold __________ seconds. Slowly return to your starting position. Repeat __________ times. Complete this exercise __________ times per day.  STRETCH - Flexion, Standing  Stand with good posture. With an underhand grip on your right / left and an overhand grip on the opposite hand, grasp a broomstick or cane so that your hands are a little more than shoulder-width apart.  Keeping your right / left elbow straight and shoulder muscles relaxed, push the stick with your opposite hand to raise your right / left arm in front of your body and then overhead. Raise your arm until you feel a stretch in your right / left shoulder, but before you have increased shoulder pain.  Avoid shrugging your  right / left shoulder as your arm rises by keeping your shoulder blade tucked down and toward your mid-back spine. Hold __________ seconds.  Slowly return to the starting position. Repeat __________ times. Complete this exercise __________ times per day. STRETCH - Abduction, Supine  Stand with good posture. With an underhand grip on your right / left and an overhand grip on the opposite hand, grasp a broomstick or cane so that your hands are a little more than shoulder-width apart.  Keeping your right / left elbow straight and shoulder muscles relaxed, push the stick with your  opposite hand to raise your right / left arm out to the side of your body and then overhead. Raise your arm until you feel a stretch in your right / left shoulder, but before you have increased shoulder pain.  Avoid shrugging your right / left shoulder as your arm rises by keeping your shoulder blade tucked down and toward your mid-back spine. Hold __________ seconds.  Slowly return to the starting position. Repeat __________ times. Complete this exercise __________ times per day. ROM - Flexion, Active-Assisted  Lie on your back. You may bend your knees for comfort.  Grasp a broomstick or cane so your hands are about shoulder-width apart. Your right / left hand should grip the end of the stick/cane so that your hand is positioned "thumbs-up," as if you were about to shake hands.  Using your healthy arm to lead, raise your right / left arm overhead until you feel a gentle stretch in your shoulder. Hold __________ seconds.  Use the stick/cane to assist in returning your right / left arm to its starting position. Repeat __________ times. Complete this exercise __________ times per day.  STRENGTHENING EXERCISES - Scapular Winging (Serratus Anterior Palsy, Long Thoracic Nerve Injury) These exercises may help you when beginning to rehabilitate your injury. They may resolve your symptoms with or without further involvement from your physician, physical therapist or athletic trainer. While completing these exercises, remember:   Muscles can gain both the endurance and the strength needed for everyday activities through controlled exercises.  Complete these exercises as instructed by your physician, physical therapist or athletic trainer. Progress with the resistance and repetition exercises only as your caregiver advises.  You may experience muscle soreness or fatigue, but the pain or discomfort you are trying to eliminate should never worsen during these exercises. If this pain does worsen, stop and  make certain you are following the directions exactly. If the pain is still present after adjustments, discontinue the exercise until you can discuss the trouble with your clinician.  During your recovery, avoid activity or exercises which involve actions that place your injured hand or elbow above your head or behind your back or head. These positions stress the tissues which are trying to heal. STRENGTH - Scapular Depression and Adduction   With good posture, sit on a firm chair. Supported your arms in front of you with pillows, arm rests or a table top. Have your elbows in line with the sides of your body.  Gently draw your shoulder blades down and toward your mid-back spine. Gradually increase the tension without tensing the muscles along the top of your shoulders and the back of your neck.  Hold for __________ seconds. Slowly release the tension and relax your muscles completely before completing the next repetition.  After you have practiced this exercise, remove the arm support and complete it in standing as well as sitting. Repeat __________ times. Complete this exercise  __________ times per day.  STRENGTH - Scapular Protractors, Standing   Stand arms-length away from a wall. Place your hands on the wall, keeping your elbows straight.  Begin by dropping your shoulder blades down and toward your mid-back spine.  To strengthen your protractors, keep your shoulder blades down, but slide them forward on your rib cage. It will feel as if you are lifting the back of your rib cage away from the wall. This is a subtle motion and can be challenging to complete. Ask your clinician for further instruction if you are not sure you are doing the exercise correctly.  Hold for __________ seconds. Slowly return to the starting position, resting the muscles completely before completing the next repetition. Repeat __________ times. Complete this exercise __________ times per day. STRENGTH - Scapular  Protractors, Supine  Lie on your back on a firm surface. Extend your right / left arm straight into the air while holding a __________ weight in your hand.  Keeping your head and back in place, lift your shoulder off the floor.  Hold __________ seconds. Slowly return to the starting position and allow your muscles to relax completely before completing the next repetition. Repeat __________ times. Complete this exercise __________ times per day. STRENGTH - Scapular Protractors, Quadruped  Get onto your hands and knees with your shoulders directly over your hands (or as close as you comfortably can be).  Keeping your elbows locked, lift the back of your rib cage up into your shoulder blades so your mid-back rounds-out. Keep your neck muscles relaxed.  Hold this position for __________ seconds. Slowly return to the starting position and allow your muscles to relax completely before completing the next repetition. Repeat __________ times. Complete this exercise __________ times per day.  STRENGTH - Scapular Depressors  Keeping your feet on the floor, lift your bottom from the seat and lock your elbows.  Keeping your elbows straight, allow gravity to pull your body weight down. Your shoulders will rise toward your ears.  Raise your body against gravity by drawing your shoulder blades down your back, shortening the distance between your shoulders and ears. Although your feet should always maintain contact with the floor, your feet should progressively support less body weight as you get stronger.  Hold __________ seconds. In a controlled and slow manner, lower your body weight to begin the next repetition. Repeat __________ times. Complete this exercise __________ times per day.  STRENGTH - Shoulder Extensors, Prone  Lie on your stomach on a firm surface so that your right / left arm overhangs the edge. Rest your forehead on your opposite forearm. With your thumb facing away from your body and  your elbow straight, hold a __________ weight in your hand.  Squeeze your right / left shoulder blade to your mid-back spine and then slowly raise your arm behind you to the height of the bed.  Hold for __________ seconds. Slowly reverse the directions and return to the starting position, controlling the weight as you lower your arm. Repeat __________ times. Complete this exercise __________ times per day.  STRENGTH - Horizontal Abductors Choose one of the two oppositions to complete this exercise. Prone: lying on stomach:  Lie on your stomach on a firm surface so that your right / left arm overhangs the edge. Rest your forehead on your opposite forearm. With your palm facing the floor and your elbow straight, hold a __________ weight in your hand.  Squeeze your right / left shoulder blade to your  mid-back spine and then slowly raise your arm to the height of the bed.  Hold for __________ seconds. Slowly reverse the directions and return to the starting position, controlling the weight as you lower your arm. Repeat __________ times. Complete this exercise __________ times per day. Standing:  Secure a rubber exercise band/tubing so that it is at the height of your shoulders when you are either standing or sitting on a firm arm-less chair.  Grasp an end of the band/tubing in each hand and have your palms face each other. Straighten your elbows and lift your hands straight in front of you at shoulder height. Step back away from the secured end of band/tubing until it becomes tense.  Squeeze your shoulder blades together. Keeping your elbows locked and your hands at shoulder-height, bring your hands out to your side.  Hold __________ seconds. Slowly ease the tension on the band/tubing as you reverse the directions and return to the starting position. Repeat __________ times. Complete this exercise __________ times per day. STRENGTH - Scapular Retractors  Secure a rubber exercise band/tubing  so that it is at the height of your shoulders when you are either standing or sitting on a firm arm-less chair.  With a palm-down grip, grasp an end of the band/tubing in each hand. Straighten your elbows and lift your hands straight in front of you at shoulder height. Step back away from the secured end of band/tubing until it becomes tense.  Squeezing your shoulder blades together, draw your elbows back as you bend them. Keep your upper arm lifted away from your body throughout the exercise.  Hold __________ seconds. Slowly ease the tension on the band/tubing as you reverse the directions and return to the starting position. Repeat __________ times. Complete this exercise __________ times per day. STRENGTH - Shoulder Extensors   Secure a rubber exercise band/tubing so that it is at the height of your shoulders when you are either standing or sitting on a firm arm-less chair.  With a thumbs-up grip, grasp an end of the band/tubing in each hand. Straighten your elbows and lift your hands straight in front of you at shoulder height. Step back away from the secured end of band/tubing until it becomes tense.  Squeezing your shoulder blades together, pull your hands down to the sides of your thighs. Do not allow your hands to go behind you.  Hold for __________ seconds. Slowly ease the tension on the band/tubing as you reverse the directions and return to the starting position. Repeat __________ times. Complete this exercise __________ times per day.  STRENGTH - Scapular Retractors and External Rotators  Secure a rubber exercise band/tubing so that it is at the height of your shoulders when you are either standing or sitting on a firm arm-less chair.  With a palm-down grip, grasp an end of the band/tubing in each hand. Bend your elbows 90 degrees and lift your elbows to shoulder height at your sides. Step back away from the secured end of band/tubing until it becomes tense.  Squeezing your  shoulder blades together, rotate your shoulder so that your upper arm and elbow remain stationary, but your fists travel upward to head-height.  Hold __________ for seconds. Slowly ease the tension on the band/tubing as you reverse the directions and return to the starting position. Repeat __________ times. Complete this exercise __________ times per day.  STRENGTH - Scapular Retractors and External Rotators, Rowing  Secure a rubber exercise band/tubing so that it is at the height  of your shoulders when you are either standing or sitting on a firm arm-less chair.  With a palm-down grip, grasp an end of the band/tubing in each hand. Straighten your elbows and lift your hands straight in front of you at shoulder height. Step back away from the secured end of band/tubing until it becomes tense.  Step 1: Squeeze your shoulder blades together. Bending your elbows, draw your hands to your chest as if you are rowing a boat. At the end of this motion, your hands and elbow should be at shoulder-height and your elbows should be out to your sides.  Step 2: Rotate your shoulder to raise your hands above your head. Your forearms should be vertical and your upper-arms should be horizontal.  Hold for __________ seconds. Slowly ease the tension on the band/tubing as you reverse the directions and return to the starting position. Repeat __________ times. Complete this exercise __________ times per day.  STRENGTH - Scapular Retractors and Elevators  Secure a rubber exercise band/tubing so that it is at the height of your shoulders when you are either standing or sitting on a firm arm-less chair.  With a thumbs-up grip, grasp an end of the band/tubing in each hand. Step back away from the secured end of band/tubing until it becomes tense.  Squeezing your shoulder blades together, straighten your elbows and lift your hands straight over your head.  Hold for __________ seconds. Slowly ease the tension on the  band/tubing as you reverse the directions and return to the starting position. Repeat __________ times. Complete this exercise __________ times per day.  Document Released: 02/16/2005 Document Revised: 05/11/2011 Document Reviewed: 05/31/2008 [facility] Patient Information 2015 [place], [place]. This information is not intended to replace advice given to you by your health care provider. Make sure you discuss any questions you have with your health care provider.
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
## 2013-12-05 NOTE — Assessment & Plan Note (Signed)
Patient's back pain is mostly secondary to muscle imbalances and patient was given home exercises. We discussed an icing protocol that I think will be beneficial. We discussed also the home exercises as well as postural changes that she'll do at work to improve her ergonomics. Patient and will follow up and see me again in 4 weeks. I do not believe the patient is a candidate for osteopathic manipulation.  Spent greater than 25 minutes with patient face-to-face and had greater than 50% of counseling including as described above in assessment and plan.

## 2014-06-06 ENCOUNTER — Other Ambulatory Visit: Payer: Self-pay

## 2014-06-06 DIAGNOSIS — Z1231 Encounter for screening mammogram for malignant neoplasm of breast: Secondary | ICD-10-CM

## 2014-06-13 ENCOUNTER — Ambulatory Visit
Admission: RE | Admit: 2014-06-13 | Discharge: 2014-06-13 | Disposition: A | Discharge status: Home / Self Care | Payer: BLUE CROSS/BLUE SHIELD | Source: Ambulatory Visit

## 2014-06-13 DIAGNOSIS — Z1231 Encounter for screening mammogram for malignant neoplasm of breast: Secondary | ICD-10-CM

## 2014-11-28 ENCOUNTER — Encounter: Payer: Self-pay | Admitting: Sports Medicine

## 2014-11-28 ENCOUNTER — Ambulatory Visit (INDEPENDENT_AMBULATORY_CARE_PROVIDER_SITE_OTHER): Payer: BLUE CROSS/BLUE SHIELD | Admitting: Sports Medicine

## 2014-11-28 VITALS — BP 126/62 | HR 76 | Ht 69.0 in | Wt 200.0 lb

## 2014-11-28 DIAGNOSIS — Q667 Congenital pes cavus: Secondary | ICD-10-CM

## 2014-11-28 DIAGNOSIS — M216X9 Other acquired deformities of unspecified foot: Secondary | ICD-10-CM

## 2014-11-28 DIAGNOSIS — M2021 Hallux rigidus, right foot: Secondary | ICD-10-CM

## 2014-11-28 DIAGNOSIS — M25561 Pain in right knee: Secondary | ICD-10-CM

## 2014-11-28 NOTE — Assessment & Plan Note (Signed)
Her continued foot pain seems to have led to compensatory changes in the foot resulting in poor biomechanics and resultant knee discomfort.   - will have her return for orthotics (she will need to bring tennis shoes to that visit) - start ankle exercises - handout provided to patient - use ankle compression sleeve

## 2014-11-28 NOTE — Assessment & Plan Note (Signed)
With marked loss of arch and pronation we need to make her orthotics to unload medial column of foot  Control any rear foot supination if that still persists

## 2014-11-28 NOTE — Assessment & Plan Note (Addendum)
Significant left patellar tracking on exam but no dislocation and ligaments are intact.  + McMurray on left but she has full extension and flexion so less likely meniscal tear.  She may have expected meniscal degenerative changes that can come with age and activity.  Given hx of right toe surgery and continued pain, her knee pain is likely due to compensatory changes in gait and with the foot to avoid pain. - she will return for orthotics to address changes in feet - continue knee sleeve - advised to start quad strengthening exercises (ie stationary bike) - continue prn NSAIDs

## 2014-11-28 NOTE — Progress Notes (Signed)
   Subjective:    Patient ID: Toni Thompson, female    DOB: 06/15/1960, 54 y.o.   MRN: [id number]  HPI Comments: Toni Thompson is a 54 year old woman with PMH as below here with c/o right lateral knee pain x 3 months.  She feels she has had RLE problems since having surgery for right hallus rigidis 2 years ago.  She has continued to have toe pain since that time.  She is a Development worker, international aid and says the knee pain started 3 months ago when she was on vacation and played several days in a row.  Since then she notices knee pain with stairs, especially descending.  Pain is 6/10 at worse.  She also feels the knee catching and sometimes feels like it may give out but she has not had any falls.  The knee is not painful during tennis play but she notes increased pain and a little swelling after tennis play.  She uses meloxicam on days she plays tennis (2-3 x per week) and she wears knee compression sleeves during tennis play.  She has not consistently used ice.       PMH:   Hallux rigidus, right foot Patello-femoral syndrome B/L bunions Acquired cavus deformity of foot Right lateral epicondylitis  Low back pain  Review of Systems  Musculoskeletal:       Right 1st toe pain Right knee pain and swelling   denies gen joint swelling/ no chest pain/ no DM/ no rashes/ no hx of gout     Filed Vitals:   11/28/14 0838  BP: 126/62  Pulse: 76  Height:  (1.753 m)  Weight: 200 lb (90.719 kg)    Objective:   Physical Exam  Constitutional: She is oriented to person, place, and time. She appears well-developed. No distress.  HENT:  Head: Normocephalic and atraumatic.  Musculoskeletal: Normal range of motion. She exhibits tenderness. She exhibits no edema.  Right knee: There is no erythema or effusion.  Temperature is normal.  Knee is TTP just lateral to lateral joint line.  Full active and passive ROM.  > 140 degree of flexion at knee.  Neg ant/post drawer, neg Lachman, neg valgus/varus  stress, + McMurray, the patella is hypermobile and she has pain when the patella is displaced laterally with knee extended.    Right foot:   Callous of plantar aspect of 1st MTP suggests over-pronation, however, she appears to correct with walking to avoid excess stress on right hallux (site of surgery)  Left foot: Callous of plantar aspect of 1st MTP suggest over-pronation Left medial longitudinal arch has dropped and she is over-pronating with walking  Neurological: She is alert and oriented to person, place, and time.  Skin: Skin is warm. She is not diaphoretic.  Psychiatric: She has a normal mood and affect. Her behavior is normal. Judgment and thought content normal.  Vitals reviewed.         Assessment & Plan:  Please see problem based charting for assessment and plan.   At least 25 minutes spend in face to face care with over 50% in direct counseling of problems below.

## 2015-01-10 ENCOUNTER — Encounter: Payer: Self-pay | Admitting: Sports Medicine

## 2015-01-10 ENCOUNTER — Ambulatory Visit (INDEPENDENT_AMBULATORY_CARE_PROVIDER_SITE_OTHER): Payer: BLUE CROSS/BLUE SHIELD | Admitting: Sports Medicine

## 2015-01-10 VITALS — BP 115/74 | HR 83 | Ht 69.0 in | Wt 200.0 lb

## 2015-01-10 DIAGNOSIS — M216X9 Other acquired deformities of unspecified foot: Secondary | ICD-10-CM | POA: Diagnosis not present

## 2015-01-10 DIAGNOSIS — M2010 Hallux valgus (acquired), unspecified foot: Secondary | ICD-10-CM | POA: Diagnosis not present

## 2015-01-10 DIAGNOSIS — R269 Unspecified abnormalities of gait and mobility: Secondary | ICD-10-CM | POA: Diagnosis not present

## 2015-01-10 DIAGNOSIS — Q667 Congenital pes cavus, unspecified foot: Secondary | ICD-10-CM | POA: Insufficient documentation

## 2015-01-10 NOTE — Progress Notes (Signed)
  Toni Thompson - 54 y.o. female MRN [id number]  Date of birth: [birth year] Toni CorneaWendy Thompson is a 54 y.o. female who presents today for bilateral foot pain.  Foot pain-patient has been having ongoing foot pain right greater than left for the past several years. She has history of a bunionectomy on the right and has tried multiple custom orthotics without success. Pain is worse on the lateral aspect of her right foot if she has been loading this area. Denies previous injuries to either aspect.  PMHx - Updated and reviewed.  Contributory factors include: Noncontributory PSHx - Updated and reviewed.  Contributory factors include:  Bunionectomy right foot FHx - Updated and reviewed.  Contributory factors include:  Noncontributory Medications - Mobic when necessary   ROS Per HPI.  12 point negative other than per HPI.   Exam:  Filed Vitals:   01/10/15 0900  BP: 115/74  Pulse: 83   Gen: NAD Cardiorespiratory - Normal respiratory effort/rate.  RRR Knee: Genu Valgus R > L  Feet: B/L pes cavus with functional collapse of medial longitudinal arch.  Hallux Valgus R > L with morton's foot B/L.  Evidence of loading of lateral column of R foot.  Neurovascular intact  Imaging:  None obtained today

## 2015-01-10 NOTE — Assessment & Plan Note (Signed)
Patient was fitted for a standard, cushioned, semi-rigid orthotic. The orthotic was heated and afterward the patient stood on the orthotic blank positioned on the orthotic stand. The patient was positioned in subtalar neutral position and 10 degrees of ankle dorsiflexion in a weight bearing stance. After completion of molding, a stable base was applied to the orthotic blank. The blank was ground to a stable position for weight bearing. Size: 9 Base: Blue EVA Additional Posting and Padding: R hindfoot wedge and R first ray post The patient ambulated these, and they were very comfortable.  I spent 40 minutes with this patient, greater than 50% was face-to-face time counseling regarding the below diagnosis.

## 2015-05-21 ENCOUNTER — Ambulatory Visit (INDEPENDENT_AMBULATORY_CARE_PROVIDER_SITE_OTHER): Payer: BLUE CROSS/BLUE SHIELD | Admitting: Sports Medicine

## 2015-05-21 ENCOUNTER — Encounter: Payer: Self-pay | Admitting: Sports Medicine

## 2015-05-21 VITALS — BP 123/70 | Ht 69.0 in | Wt 190.0 lb

## 2015-05-21 DIAGNOSIS — M7712 Lateral epicondylitis, left elbow: Secondary | ICD-10-CM | POA: Diagnosis not present

## 2015-05-21 NOTE — Assessment & Plan Note (Signed)
Standard eccentric exercises  No NTG 2/2 migraines  meloxicam daily for 2 to 3 wks  Compression  Ice massage  Reck 6 wks

## 2015-05-21 NOTE — Progress Notes (Signed)
Patient ID: Garfield CorneaWendy Ricciardelli, female   DOB: 1960-06-13, 55 y.o.   MRN: [id number]  CC: left elbow pain  Patient decided to push a fitness program 6 weeks ago she was trying some curls and forearm work with 20 lbs Since then pain on lateral elbow Avoiding a lot of exercises 2/2 pain Not much change with oral meloxicam  Soc Hx: work involves a lot of typing This is painful  ROS No neck pain No radiating sxs into left arm No swelling Now some night pain Hurts to lift anything  PEXAM NAD BP 123/70 mmHg  Ht 5\' 9"  (1.753 m)  Wt 190 lb (86.183 kg)  BMI 28.05 kg/m2  Full ROM of left elbow No visible swelling TTP over left epicondyle Feel tenderness deep in forearm but no TTP Pain on resisted finger extension Pain on resisted wrist extension Book test very painfu  US Left elbwo No effusion Lateral epiccondyle shows hypoechoic change in proximal extensor tendons No tear noted Increased doppler flow and neovesels

## 2015-05-21 NOTE — Patient Instructions (Signed)
This is classic tennis elbow  Probably triggered by too much weight but aggravated by typing  Use compression Ice massage not continuous  3 key exercises Wrist extension and stretch - down slow/ up fast - 3 sets of 15 Wrist rolls - over slow and back fast - 3 sets of 15 Easy but fast ball squeeze - quick 25 squeezes - 3 sets  Change position for typing frequently  Be careful with any weight exercise that hurts the elbow - or pushups  For the knees be careful of any knee bend position more than about 45 degrees - so limit both squats and lunges  See me in 6 weeks

## 2015-05-22 ENCOUNTER — Ambulatory Visit: Payer: BLUE CROSS/BLUE SHIELD | Admitting: Sports Medicine

## 2015-06-25 ENCOUNTER — Encounter: Payer: Self-pay | Admitting: Sports Medicine

## 2015-06-25 ENCOUNTER — Ambulatory Visit (INDEPENDENT_AMBULATORY_CARE_PROVIDER_SITE_OTHER): Payer: BLUE CROSS/BLUE SHIELD | Admitting: Sports Medicine

## 2015-06-25 VITALS — BP 101/61 | Ht 69.0 in | Wt 185.0 lb

## 2015-06-25 DIAGNOSIS — M7712 Lateral epicondylitis, left elbow: Secondary | ICD-10-CM | POA: Diagnosis not present

## 2015-06-25 NOTE — Progress Notes (Signed)
CC: left elbow pain  Follow up from lateral epicondylitis pain 6 weeks ago, several weeks of pain prior to that. Using Mobic with some relief, iced intermittently over first few weeks since last visit. Had improvement overall. Typing at work is not as bothersome now. Returned to [employer] at the gym and had flare of index symptoms one week ago. Iced, took week off. Tried lighter weights last night and still feels ok today. Direct pressure over the area is tender. Still painful with some daily tasks like lifting laundry detergent.  Has mostly been avoiding trigger activities.   Soc Hx: work involves a lot of typing This is painful Plays tennis w RT hand  ROS No neck pain No radiating sxs into left arm No swelling  PE BP 101/61 mmHg  Ht 5\' 9"  (1.753 m)  Wt 185 lb (83.915 kg)  BMI 27.31 kg/m2 WDWN, NAD Nonlabored breathing, skin is warm and dry  Full ROM of left elbow No visible swelling Mild TTP over left epicondyle Feel tenderness deep in forearm but no TTP Pain on resisted finger extension Pain on resisted wrist extension  US left elbow from last visit showed:  Lateral epicondyle shows hypoechoic change in proximal extensor tendons  No tear noted Increased doppler flow and neovessels Today: No effusion. Much improved overall. Minimal doppler flow with resolution of neovessels. Mild hypoechoic area over point of maximal tenderness, most obvious in transverse view.  A/P.  1. Lateral epicondylitis, left. Improving, subacute.   Probably triggered by too much weight while lifting (20 lb dumbell) but aggravated by typing. Has decreased weight at gym and is not bothered as much by typing now.  Use compression Ice massage as needed  3 key exercises Wrist extension and stretch - down slow/ up fast - 3 sets of 15 Wrist rolls - over slow and back fast - 3 sets of 15 Easy but fast ball squeeze - quick 25 squeezes - 3 sets  Change position for typing frequently  Be careful with  any weight exercise that hurts the elbow or extends wrist.   See me in 6-8 weeks if not fully improved. Anticipate total 12-14 weeks for resolution.

## 2015-06-25 NOTE — Assessment & Plan Note (Signed)
Seems improved on US and exam  Cont with HEP and otehre RX  See in 6 weeks

## 2015-07-17 DIAGNOSIS — I788 Other diseases of capillaries: Secondary | ICD-10-CM | POA: Diagnosis not present

## 2015-07-17 DIAGNOSIS — D2261 Melanocytic nevi of right upper limb, including shoulder: Secondary | ICD-10-CM | POA: Diagnosis not present

## 2015-07-17 DIAGNOSIS — D692 Other nonthrombocytopenic purpura: Secondary | ICD-10-CM | POA: Diagnosis not present

## 2015-07-17 DIAGNOSIS — Z85828 Personal history of other malignant neoplasm of skin: Secondary | ICD-10-CM | POA: Diagnosis not present

## 2015-07-17 DIAGNOSIS — D1801 Hemangioma of skin and subcutaneous tissue: Secondary | ICD-10-CM | POA: Diagnosis not present

## 2015-07-22 ENCOUNTER — Other Ambulatory Visit: Payer: Self-pay

## 2015-07-22 DIAGNOSIS — Z1231 Encounter for screening mammogram for malignant neoplasm of breast: Secondary | ICD-10-CM

## 2015-07-31 DIAGNOSIS — Z1212 Encounter for screening for malignant neoplasm of rectum: Secondary | ICD-10-CM | POA: Diagnosis not present

## 2015-07-31 DIAGNOSIS — Z01419 Encounter for gynecological examination (general) (routine) without abnormal findings: Secondary | ICD-10-CM | POA: Diagnosis not present

## 2015-07-31 DIAGNOSIS — Z6828 Body mass index (BMI) 28.0-28.9, adult: Secondary | ICD-10-CM | POA: Diagnosis not present

## 2015-08-13 ENCOUNTER — Ambulatory Visit
Admission: RE | Admit: 2015-08-13 | Discharge: 2015-08-13 | Disposition: A | Discharge status: Home / Self Care | Payer: BLUE CROSS/BLUE SHIELD | Source: Ambulatory Visit

## 2015-08-13 DIAGNOSIS — Z1231 Encounter for screening mammogram for malignant neoplasm of breast: Secondary | ICD-10-CM

## 2015-08-19 DIAGNOSIS — H3509 Other intraretinal microvascular abnormalities: Secondary | ICD-10-CM | POA: Diagnosis not present

## 2015-08-26 DIAGNOSIS — E785 Hyperlipidemia, unspecified: Secondary | ICD-10-CM | POA: Diagnosis not present

## 2015-08-26 DIAGNOSIS — H3562 Retinal hemorrhage, left eye: Secondary | ICD-10-CM | POA: Diagnosis not present

## 2015-08-26 DIAGNOSIS — H6982 Other specified disorders of Eustachian tube, left ear: Secondary | ICD-10-CM | POA: Diagnosis not present

## 2015-09-18 DIAGNOSIS — Z9889 Other specified postprocedural states: Secondary | ICD-10-CM | POA: Diagnosis not present

## 2015-09-18 DIAGNOSIS — Z6826 Body mass index (BMI) 26.0-26.9, adult: Secondary | ICD-10-CM | POA: Diagnosis not present

## 2015-10-04 ENCOUNTER — Other Ambulatory Visit: Payer: Self-pay | Admitting: Neurosurgery

## 2015-10-04 DIAGNOSIS — Z9889 Other specified postprocedural states: Secondary | ICD-10-CM

## 2015-10-31 DIAGNOSIS — M79673 Pain in unspecified foot: Secondary | ICD-10-CM | POA: Diagnosis not present

## 2015-10-31 DIAGNOSIS — Z0000 Encounter for general adult medical examination without abnormal findings: Secondary | ICD-10-CM | POA: Diagnosis not present

## 2015-11-12 DIAGNOSIS — H1045 Other chronic allergic conjunctivitis: Secondary | ICD-10-CM | POA: Diagnosis not present

## 2015-12-02 DIAGNOSIS — H1089 Other conjunctivitis: Secondary | ICD-10-CM | POA: Diagnosis not present

## 2015-12-03 DIAGNOSIS — Z23 Encounter for immunization: Secondary | ICD-10-CM | POA: Diagnosis not present

## 2015-12-03 DIAGNOSIS — Z9109 Other allergy status, other than to drugs and biological substances: Secondary | ICD-10-CM | POA: Diagnosis not present

## 2015-12-11 DIAGNOSIS — D692 Other nonthrombocytopenic purpura: Secondary | ICD-10-CM | POA: Diagnosis not present

## 2016-01-07 DIAGNOSIS — M26629 Arthralgia of temporomandibular joint, unspecified side: Secondary | ICD-10-CM | POA: Diagnosis not present

## 2016-01-07 DIAGNOSIS — K219 Gastro-esophageal reflux disease without esophagitis: Secondary | ICD-10-CM | POA: Diagnosis not present

## 2016-01-07 DIAGNOSIS — H9313 Tinnitus, bilateral: Secondary | ICD-10-CM | POA: Diagnosis not present

## 2016-01-20 ENCOUNTER — Ambulatory Visit
Admission: RE | Admit: 2016-01-20 | Discharge: 2016-01-20 | Disposition: A | Discharge status: Home / Self Care | Payer: BLUE CROSS/BLUE SHIELD | Source: Ambulatory Visit | Attending: Neurosurgery | Admitting: Neurosurgery

## 2016-01-20 DIAGNOSIS — Z9889 Other specified postprocedural states: Secondary | ICD-10-CM

## 2016-01-20 DIAGNOSIS — R930 Abnormal findings on diagnostic imaging of skull and head, not elsewhere classified: Secondary | ICD-10-CM | POA: Diagnosis not present

## 2016-02-03 DIAGNOSIS — R030 Elevated blood-pressure reading, without diagnosis of hypertension: Secondary | ICD-10-CM | POA: Diagnosis not present

## 2016-02-03 DIAGNOSIS — Z9889 Other specified postprocedural states: Secondary | ICD-10-CM | POA: Diagnosis not present

## 2016-02-03 DIAGNOSIS — Z6825 Body mass index (BMI) 25.0-25.9, adult: Secondary | ICD-10-CM | POA: Diagnosis not present

## 2016-03-26 ENCOUNTER — Encounter: Payer: Self-pay | Admitting: Sports Medicine

## 2016-03-26 ENCOUNTER — Ambulatory Visit (INDEPENDENT_AMBULATORY_CARE_PROVIDER_SITE_OTHER): Payer: BLUE CROSS/BLUE SHIELD | Admitting: Sports Medicine

## 2016-03-26 VITALS — BP 116/60 | Ht 69.0 in | Wt 162.0 lb

## 2016-03-26 DIAGNOSIS — M545 Low back pain, unspecified: Secondary | ICD-10-CM
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
## 2016-04-15 DIAGNOSIS — D1801 Hemangioma of skin and subcutaneous tissue: Secondary | ICD-10-CM | POA: Diagnosis not present

## 2016-04-15 DIAGNOSIS — D2262 Melanocytic nevi of left upper limb, including shoulder: Secondary | ICD-10-CM | POA: Diagnosis not present

## 2016-04-15 DIAGNOSIS — D2261 Melanocytic nevi of right upper limb, including shoulder: Secondary | ICD-10-CM | POA: Diagnosis not present

## 2016-04-15 DIAGNOSIS — L814 Other melanin hyperpigmentation: Secondary | ICD-10-CM | POA: Diagnosis not present

## 2016-05-04 DIAGNOSIS — Z131 Encounter for screening for diabetes mellitus: Secondary | ICD-10-CM | POA: Diagnosis not present

## 2016-05-04 DIAGNOSIS — Z0000 Encounter for general adult medical examination without abnormal findings: Secondary | ICD-10-CM | POA: Diagnosis not present

## 2016-05-04 DIAGNOSIS — E559 Vitamin D deficiency, unspecified: Secondary | ICD-10-CM | POA: Diagnosis not present

## 2016-05-04 DIAGNOSIS — Z13220 Encounter for screening for lipoid disorders: Secondary | ICD-10-CM | POA: Diagnosis not present

## 2016-06-04 ENCOUNTER — Ambulatory Visit (INDEPENDENT_AMBULATORY_CARE_PROVIDER_SITE_OTHER): Payer: BLUE CROSS/BLUE SHIELD | Admitting: Sports Medicine

## 2016-06-04 ENCOUNTER — Encounter: Payer: Self-pay | Admitting: Sports Medicine

## 2016-06-04 ENCOUNTER — Ambulatory Visit
Admission: RE | Admit: 2016-06-04 | Discharge: 2016-06-04 | Disposition: A | Discharge status: Home / Self Care | Payer: BLUE CROSS/BLUE SHIELD | Source: Ambulatory Visit | Attending: Sports Medicine | Admitting: Sports Medicine

## 2016-06-04 VITALS — BP 116/63 | Ht 69.0 in | Wt 162.0 lb

## 2016-06-04 DIAGNOSIS — M542 Cervicalgia: Secondary | ICD-10-CM | POA: Diagnosis not present

## 2016-06-04 DIAGNOSIS — M5412 Radiculopathy, cervical region: Secondary | ICD-10-CM | POA: Diagnosis not present

## 2016-06-04 MED ORDER — AMITRIPTYLINE HCL 25 MG PO TABS: 25.0000 mg | ORAL_TABLET | Freq: Every day | ORAL | 1 refills | Status: DC

## 2016-06-04 NOTE — Assessment & Plan Note (Addendum)
-   Concern for cervical radiculopathy given distribution of symptoms; also has component of muscle spasm, likely due to compensation 2/2 pain; do not suspect rotator cuff injury - Will order c-spine complete  XRays reviewed and show severe DDD at C5/6; anterior spurring; foraminal narrowing  - Could try computer glasses to reduce extension of neck with computer work  Consider intermittent use of collar

## 2016-06-04 NOTE — Assessment & Plan Note (Addendum)
-   Exam consistent with C5-C6 involvement (numbness of R fingers 1-3) and wonder if some C3 given areas of spasm over face - Recommended amitriptyline 25 mg QHS - Try c-collar with sleeping vs harder pillow  With findings on XR we will strongly encourage trying some medication to lessen sxs
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
## 2016-06-05 ENCOUNTER — Other Ambulatory Visit: Payer: Self-pay | Admitting: *Deleted

## 2016-06-05 DIAGNOSIS — M542 Cervicalgia: Secondary | ICD-10-CM | POA: Diagnosis not present

## 2016-06-05 MED ORDER — CYCLOBENZAPRINE HCL 10 MG PO TABS: 10.0000 mg | ORAL_TABLET | Freq: Every evening | ORAL | 0 refills | Status: DC | PRN

## 2016-06-10 DIAGNOSIS — L723 Sebaceous cyst: Secondary | ICD-10-CM | POA: Diagnosis not present

## 2016-06-10 DIAGNOSIS — N9089 Other specified noninflammatory disorders of vulva and perineum: Secondary | ICD-10-CM | POA: Diagnosis not present

## 2016-06-11 ENCOUNTER — Telehealth: Payer: Self-pay | Admitting: *Deleted

## 2016-06-11 ENCOUNTER — Ambulatory Visit: Payer: BLUE CROSS/BLUE SHIELD | Admitting: Sports Medicine

## 2016-06-11 DIAGNOSIS — M5412 Radiculopathy, cervical region: Secondary | ICD-10-CM

## 2016-06-11 DIAGNOSIS — M542 Cervicalgia: Secondary | ICD-10-CM

## 2016-06-11 NOTE — Telephone Encounter (Signed)
New PT order placed.

## 2016-06-12 ENCOUNTER — Encounter: Payer: Self-pay | Admitting: Physical Therapy

## 2016-06-12 ENCOUNTER — Ambulatory Visit: Payer: BLUE CROSS/BLUE SHIELD | Attending: Family Medicine | Admitting: Physical Therapy

## 2016-06-12 DIAGNOSIS — M25511 Pain in right shoulder: Secondary | ICD-10-CM | POA: Diagnosis not present

## 2016-06-12 DIAGNOSIS — M79601 Pain in right arm: Secondary | ICD-10-CM

## 2016-06-12 DIAGNOSIS — M542 Cervicalgia: Secondary | ICD-10-CM | POA: Insufficient documentation

## 2016-06-12 DIAGNOSIS — G8929 Other chronic pain: Secondary | ICD-10-CM | POA: Insufficient documentation

## 2016-06-12 NOTE — Therapy (Signed)
[facility] Outpatient Rehabilitation [facility] [address] [place], [place], [address] Phone: [phone]   Fax:  [phone]  Physical Therapy Evaluation  Patient Details  Name: Zahriyah Joo MRN: [id number] Date of Birth: 17-Sep-1960 Referring Provider: Enid Baas, MD  Encounter Date: 06/12/2016      PT End of Session - 06/12/16 1211    Visit Number 1   Number of Visits 2   Date for PT Re-Evaluation 06/19/16   Authorization Type BCBS   PT Start Time 0801   PT Stop Time 0852   PT Time Calculation (min) 51 min   Activity Tolerance Patient limited by pain   Behavior During Therapy Agitated  stressed      Past Medical History:  Diagnosis Date  . Contact lens/glasses fitting    wears contacts or glasses    Past Surgical History:  Procedure Laterality Date  . CHEILECTOMY Right 01/12/2013   Procedure: RIGHT HALLUX RIGIDUS WITH CHEILECTOMY 1ST METATARSOPHALANGEAL;  Surgeon: Loreta Ave, MD;  Location: [facility];  Service: Orthopedics;  Laterality: Right;  . COLONOSCOPY    . CRANIOTOMY FOR TUMOR     benign age 56  . DILATION AND CURETTAGE OF UTERUS      There were no vitals filed for this visit.       Subjective Assessment - 06/12/16 0804    Subjective Approx 6 weeks ago began feeling pain in R shoulder and arm after hitting a lot of overheads in tennis. Hand started falling asleep at night.    Patient Stated Goals decrease pain   Currently in Pain? Yes   Pain Score --  not specified   Pain Location Neck   Pain Orientation Right   Pain Descriptors / Indicators Aching;Tightness;Sore;Shooting;Tingling;Numbness   Pain Frequency Constant   Aggravating Factors  laying down to sleep, working on computer   Pain Relieving Factors sleeping with cervical brace, massage            [facility] PT Assessment - 06/12/16 0001      Assessment   Medical Diagnosis neck pain with cervical radiculopathy   Referring Provider Enid Baas,  MD     Precautions   Precaution Comments TMJD     Prior Function   Level of Independence Independent   Vocation Full time employment   Vocation Requirements computer work.     Cognition   Overall Cognitive Status Within Functional Limits for tasks assessed     Sensation   Additional Comments "asleep" sensation in R hand     Posture/Postural Control   Posture Comments upright with fwd lean, fwd head posture; changed to slouching with legs crossed while talking     ROM / Strength   AROM / PROM / Strength AROM     AROM   Overall AROM Comments WFL     Palpation   Palpation comment TTP cervical and R shoulder                   [facility] Adult PT Treatment/Exercise - 06/12/16 0001      Therapeutic Activites    Therapeutic Activities Work Counselling psychologist   Work Magazine features editor   Exercises Neck     Neck Exercises: Seated   Neck Retraction Limitations chin tuck while seated and supine   Other Seated Exercise shoulder external rotation yellow tband for posutral alignment     Manual Therapy   Manual Therapy Soft tissue mobilization;Myofascial release;Joint mobilization   Manual  therapy comments edu on use of theracane   Joint Mobilization R facet opening gr 3, R first rib depression   Soft tissue mobilization suboccipitals, SCM, levator, scalenes, upper trap   Myofascial Release ischemic release R upper trap                PT Education - 06/12/16 1210    Education provided Yes   Education Details anatomy of condition, POC, HEP, exercise form/rationale, rationale for manual therapy, TMJ connection with cervical & arm pain, posture & work Arts development officer) Educated Patient   Methods Explanation;Demonstration;Tactile cues;Verbal cues   Comprehension Verbalized understanding;Returned demonstration;Verbal cues required;Tactile cues required;Need further instruction          PT Short Term Goals - 06/12/16 1223      PT SHORT TERM  GOAL #1   Title Pt will verbalize awareness of proper postural musculature to decrease strain on cervical region during daily activities.    Baseline began educating at eval   Time 1   Period Weeks   Status New     PT SHORT TERM GOAL #2   Title Pt will verbalize decreased pain by 10% by utilizing postural alignment    Baseline began educating at eval   Time 1   Period Weeks   Status New                  Plan - 06/12/16 1212    Clinical Impression Statement Pt presents to PT with complaints of neck and R shoulder pain that extends to her arm. Pt also reports having a script for PT to treat TMJ but wants to take care of her pinched nerve first. Concordant pain found in trigger points and tighness of musculature in cervical region. Pt reports sitting to work at a desk that is too high and sits with feet up on a stool, slouched back and reaches forward for keyboard. Pt reports sleeping curled up which causes her arm to go to sleep but is improved with wearing brace. Extensive discussion held today regarding posture at work and imprtance of awareness of postural alignment. Educated on anatomy of cervical region and how tightness can cause pain down arm as well as close association with TMJ. Pt reported feeling some pain at the base of her skull following treatment today and was educating that all the myofascial pain would not go away with just one manual treatment. Pt asked about massage which I told her was ok but maybe decrease pressure so she is able to allow relaxation to occur. Pt was very stressed about her pain and about the cost of treatment today but would like to return for one more visit due to time it is taking to get in to see her usual PT. Does not want to try dry needling due to talking to 2 friends who had poor experiences with it in the past. I educated pt that she is strong and now has to work to utilize her strength to improve postural alignment during daily activities in  order to see carryover from strength. Will work to reduce myofasical pain at next visit and challenge postural musculature.    Rehab Potential Fair   PT Frequency --  2 visits.    PT Treatment/Interventions ADLs/Self Care Home Management;Cryotherapy;Electrical Stimulation;Iontophoresis /ml Dexamethasone;Functional mobility training;Ultrasound;Traction;Moist Heat;Therapeutic activities;Therapeutic exercise;Neuromuscular re-education;Patient/family education;Passive range of motion;Manual techniques;Dry needling;Taping   PT Next Visit Plan manual techniques to decrease pain, posture   PT Home Exercise Plan GHJ ER  yellow tband, postural awareness, chin tucks, work Psychologist, clinical with Plan of Care Patient      Patient will benefit from skilled therapeutic intervention in order to improve the following deficits and impairments:  Pain, Increased muscle spasms, Decreased mobility, Impaired flexibility, Impaired sensation, Postural dysfunction, Improper body mechanics  Visit Diagnosis: Cervicalgia - Plan: PT plan of care cert/re-cert  Chronic right shoulder pain - Plan: PT plan of care cert/re-cert  Pain in right arm - Plan: PT plan of care cert/re-cert     Problem List Patient Active Problem List   Diagnosis Date Noted  . Neck pain 06/04/2016  . Cervical radiculopathy 06/04/2016  . Lateral epicondylitis of left elbow 05/21/2015  . Abnormality of gait 01/10/2015  . Pes cavus 01/10/2015  . Hallux valgus 01/10/2015  . Low back pain 11/07/2013  . Hallux rigidus of right foot 11/07/2013  . Contact dermatitis 07/27/2012  . Right elbow pain 05/17/2012  . Left knee pain 05/17/2012  . Right lateral epicondylitis 05/17/2012  . 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 BUNIONS, BILATERAL 03/15/2008  . CAVUS DEFORMITY OF FOOT, ACQUIRED 03/15/2008  . PALPITATIONS 03/15/2008    Dhyana Bastone C. Nihira Puello PT, DPT 06/12/16 12:27 PM   [facility] Outpatient Rehabilitation  [facility] [address] [place], [place], [address] Phone: [phone]   Fax:  [phone]  Name: Janin Kozlowski MRN: [id number] Date of Birth: 1960-11-29

## 2016-06-16 ENCOUNTER — Ambulatory Visit: Payer: BLUE CROSS/BLUE SHIELD | Admitting: Physical Therapy

## 2016-06-16 ENCOUNTER — Encounter: Payer: Self-pay | Admitting: Physical Therapy

## 2016-06-16 DIAGNOSIS — M79601 Pain in right arm: Secondary | ICD-10-CM | POA: Diagnosis not present

## 2016-06-16 DIAGNOSIS — M542 Cervicalgia: Secondary | ICD-10-CM | POA: Diagnosis not present

## 2016-06-16 DIAGNOSIS — M25511 Pain in right shoulder: Secondary | ICD-10-CM | POA: Diagnosis not present

## 2016-06-16 DIAGNOSIS — G8929 Other chronic pain: Secondary | ICD-10-CM | POA: Diagnosis not present

## 2016-06-16 NOTE — Therapy (Signed)
[facility] Outpatient Rehabilitation [facility] [address] [place], [place], [address] Phone: [phone]   Fax:  [phone]  Physical Therapy Treatment  Patient Details  Name: Toni Thompson MRN: [id number] Date of Birth: 1961-02-28 Referring Provider: Enid Baas, MD  Encounter Date: 06/16/2016      PT End of Session - 06/16/16 1153    Visit Number 2   Number of Visits 3   Date for PT Re-Evaluation 06/19/16   Authorization Type BCBS   PT Start Time 1150   PT Stop Time 1248   PT Time Calculation (min) 58 min   Activity Tolerance Patient tolerated treatment well   Behavior During Therapy [facility] for tasks assessed/performed      Past Medical History:  Diagnosis Date  . Contact lens/glasses fitting    wears contacts or glasses    Past Surgical History:  Procedure Laterality Date  . CHEILECTOMY Right 01/12/2013   Procedure: RIGHT HALLUX RIGIDUS WITH CHEILECTOMY 1ST METATARSOPHALANGEAL;  Surgeon: Loreta Ave, MD;  Location: [facility];  Service: Orthopedics;  Laterality: Right;  . COLONOSCOPY    . CRANIOTOMY FOR TUMOR     benign age 47  . DILATION AND CURETTAGE OF UTERUS      There were no vitals filed for this visit.      Subjective Assessment - 06/16/16 1151    Subjective Pt reports feeling a band feeling around her neck right now. Feels about the same. Reports desk is unable to lower and has had to look up at computer.    Currently in Pain? Yes   Pain Score 5    Pain Location Neck   Pain Orientation Right            [facility] PT Assessment - 06/16/16 0001      Assessment   Medical Diagnosis neck pain with cervical radiculopathy   Referring Provider Enid Baas, MD                     [facility] Adult PT Treatment/Exercise - 06/16/16 0001      Exercises   Exercises Shoulder     Neck Exercises: Machines for Strengthening   UBE (Upper Arm Bike) retro 4 min L1     Neck Exercises: Supine   Other Supine  Exercise crunches and oblique twists, cues for form     Shoulder Exercises: Prone   Retraction Limitations rretraciton + extension   Other Prone Exercises prone extension + abduction pull green tband     Shoulder Exercises: Standing   ABduction Limitations green tband behind back   Other Standing Exercises triceps kicks     Shoulder Exercises: ROM/Strengthening   Cybex Row Limitations cues for form, 20#   Other ROM/Strengthening Exercises lat pull down machine, cues for form 20#     Manual Therapy   Joint Mobilization R facet opening   Soft tissue mobilization suboccipital release, trigger point release paraspinals on R, R upper trap trigger point release                PT Education - 06/16/16 1256    Education provided Yes   Education Details exercise form/rationale, biomechanical chain interraction of neck/shoulder/arm, trigger points & referred pain, being aware of musculature contractions while at gym   Person(s) Educated Patient   Methods Explanation;Demonstration;Tactile cues;Verbal cues;Handout   Comprehension Verbalized understanding;Returned demonstration;Verbal cues required;Tactile cues required;Need further instruction          PT Short Term Goals - 06/12/16  1223      PT SHORT TERM GOAL #1   Title Pt will verbalize awareness of proper postural musculature to decrease strain on cervical region during daily activities.    Baseline began educating at eval   Time 1   Period Weeks   Status New     PT SHORT TERM GOAL #2   Title Pt will verbalize decreased pain by 10% by utilizing postural alignment    Baseline began educating at eval   Time 1   Period Weeks   Status New                  Plan - 06/16/16 1300    Clinical Impression Statement Pt verbalized concordant pain when trigger points were palpated in L scalenes and R upper traps. Pt is scheduled for massage today, I educated her on utilizing less pressure with deep tissue approach so she  is able to relax. Pt verbalized fatigue in prone exercises indicating improper use of periscapular musculature for postural endurance. I asked pt to stand at work as much as she can during the day to avoid excessive, long term cerivcal extension. I also educated her on return to tennis as tolerated, beginning with smaller motions such as volley before trying serves.    PT Next Visit Plan manual techniques to decrease pain, periscpular activation   PT Home Exercise Plan GHJ ER yellow tband, postural awareness, chin tucks, work ergonomics; prone scapular retraction + extension   Consulted and Agree with Plan of Care Patient      Patient will benefit from skilled therapeutic intervention in order to improve the following deficits and impairments:     Visit Diagnosis: Cervicalgia - Plan: PT plan of care cert/re-cert  Chronic right shoulder pain - Plan: PT plan of care cert/re-cert  Pain in right arm - Plan: PT plan of care cert/re-cert     Problem List Patient Active Problem List   Diagnosis Date Noted  . Neck pain 06/04/2016  . Cervical radiculopathy 06/04/2016  . Lateral epicondylitis of left elbow 05/21/2015  . Abnormality of gait 01/10/2015  . Pes cavus 01/10/2015  . Hallux valgus 01/10/2015  . Low back pain 11/07/2013  . Hallux rigidus of right foot 11/07/2013  . Contact dermatitis 07/27/2012  . Right elbow pain 05/17/2012  . Left knee pain 05/17/2012  . Right lateral epicondylitis 05/17/2012  . 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 BUNIONS, BILATERAL 03/15/2008  . CAVUS DEFORMITY OF FOOT, ACQUIRED 03/15/2008  . PALPITATIONS 03/15/2008   Rinoa Garramone C. Kaidynce Pfister PT, DPT 06/16/16 1:09 PM   [facility] Outpatient Rehabilitation [facility] [address] [place], [place], [address] Phone: [phone]   Fax:  [phone]  Name: Toni Thompson MRN: [id number] Date of Birth: 02/22/1961

## 2016-06-18 DIAGNOSIS — M5413 Radiculopathy, cervicothoracic region: Secondary | ICD-10-CM | POA: Diagnosis not present

## 2016-06-18 DIAGNOSIS — M542 Cervicalgia: Secondary | ICD-10-CM | POA: Diagnosis not present

## 2016-06-19 ENCOUNTER — Ambulatory Visit: Payer: BLUE CROSS/BLUE SHIELD | Admitting: Physical Therapy

## 2016-06-19 ENCOUNTER — Encounter: Payer: Self-pay | Admitting: Physical Therapy

## 2016-06-19 DIAGNOSIS — G8929 Other chronic pain: Secondary | ICD-10-CM

## 2016-06-19 DIAGNOSIS — M79601 Pain in right arm: Secondary | ICD-10-CM

## 2016-06-19 DIAGNOSIS — M25511 Pain in right shoulder: Secondary | ICD-10-CM

## 2016-06-19 DIAGNOSIS — M542 Cervicalgia: Secondary | ICD-10-CM

## 2016-06-19 NOTE — Therapy (Signed)
[place], [place], [address] Phone: [phone]   Fax:  [phone]  Physical Therapy Treatment  Patient Details  Name: Toni Thompson MRN: [id number] Date of Birth: 06/12/1960 Referring Provider: Stefanie Libel, MD  Encounter Date: 06/19/2016      PT End of Session - 06/19/16 1105    Visit Number 3   Number of Visits 3   Date for PT Re-Evaluation 06/19/16   Authorization Type BCBS   PT Start Time 1105  pt arrived late   PT Stop Time 1153   PT Time Calculation (min) 48 min   Activity Tolerance Patient tolerated treatment well   Behavior During Therapy [facility] for tasks assessed/performed      Past Medical History:  Diagnosis Date  . Contact lens/glasses fitting    wears contacts or glasses    Past Surgical History:  Procedure Laterality Date  . CHEILECTOMY Right 01/12/2013   Procedure: RIGHT HALLUX RIGIDUS WITH CHEILECTOMY 1ST METATARSOPHALANGEAL;  Surgeon: Ninetta Lights, MD;  Location: [place];  Service: Orthopedics;  Laterality: Right;  . COLONOSCOPY    . CRANIOTOMY FOR TUMOR     benign age 7  . DILATION AND CURETTAGE OF UTERUS      There were no vitals filed for this visit.      Subjective Assessment - 06/19/16 1105    Subjective Pt reports neck and shoulder pain is not good today. R lateral cervical and jaw pain. symptoms down arm are about the same. pain was decreased in intensity since last visit until today. Is going to sort out her desk this weekend for improved posture.    Patient Stated Goals decrease pain   Pain Score 7    Pain Location Neck   Pain Orientation Right   Pain Descriptors / Indicators --  "band" in occipital region                         [facility] Adult PT Treatment/Exercise - 06/19/16 0001      Manual Therapy   Joint Mobilization R facet opening, prone scapular mobility, gross rib ER, thoracic PA   Myofascial Release  suboccipital release, R temporalis, levator scapula, R rhomboids                PT Education - 06/19/16 1111    Education provided Yes   Education Details manual rationale, referred pain creating distal muscle spasm, nerve mobilization, importance of postural awareness   Person(s) Educated Patient   Methods Explanation;Demonstration;Tactile cues;Verbal cues   Comprehension Verbalized understanding;Returned demonstration;Verbal cues required;Tactile cues required;Need further instruction          PT Short Term Goals - 06/19/16 1207      PT SHORT TERM GOAL #1   Title Pt will verbalize awareness of proper postural musculature to decrease strain on cervical region during daily activities.    Baseline reports she is trying, limited by work station at desk    Status Partially Met     PT [place] #2   Title Pt will verbalize decreased pain by 10% by utilizing postural alignment    Baseline reports pain is down for a while after treatment but returns to "about the same"   Status Unable to assess                  Plan - 06/19/16 1201    Clinical Impression Statement Pt verbalized decrease in symtpoms  into her arm and median nerve glide felt good. Concordant pain found in R suboccipitals and temporalis trigger points which were released and pt reproted jaw felt "stretched out". Pt will be d/c at this time to seek treatment at another office for financial reasons. Pt was instructed to contact us with any further questions.    Consulted and Agree with Plan of Care Patient      Patient will benefit from skilled therapeutic intervention in order to improve the following deficits and impairments:     Visit Diagnosis: Cervicalgia  Chronic right shoulder pain  Pain in right arm     Problem List Patient Active Problem List   Diagnosis Date Noted  . Neck pain 06/04/2016  . Cervical radiculopathy 06/04/2016  . Lateral epicondylitis of left elbow 05/21/2015  .  Abnormality of gait 01/10/2015  . Pes cavus 01/10/2015  . Hallux valgus 01/10/2015  . Low back pain 11/07/2013  . Hallux rigidus of right foot 11/07/2013  . Contact dermatitis 07/27/2012  . Right elbow pain 05/17/2012  . Left knee pain 05/17/2012  . Right lateral epicondylitis 05/17/2012  . 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 BUNIONS, BILATERAL 03/15/2008  . CAVUS DEFORMITY OF FOOT, ACQUIRED 03/15/2008  . PALPITATIONS 03/15/2008   PHYSICAL THERAPY DISCHARGE SUMMARY  Visits from Start of Care: 3  Current functional level related to goals / functional outcomes: See above   Remaining deficits: See above   Education / Equipment: Anatomy of condition, POC, HEP, exercise form/rationale  Plan: Patient agrees to discharge.  Patient goals were not met. Patient is being discharged due to financial reasons.  ?????      Jessica C. Hightower PT, DPT 06/19/16 12:11 PM   [facility] [address] [place], [place], [address] Phone: [phone]   Fax:  [phone]  Name: Toni Thompson MRN: [id number] Date of Birth: 07/22/1960   

## 2016-06-22 ENCOUNTER — Ambulatory Visit: Payer: BLUE CROSS/BLUE SHIELD | Admitting: Physical Therapy

## 2016-06-23 DIAGNOSIS — M542 Cervicalgia: Secondary | ICD-10-CM | POA: Diagnosis not present

## 2016-06-23 DIAGNOSIS — M2021 Hallux rigidus, right foot: Secondary | ICD-10-CM | POA: Diagnosis not present

## 2016-06-23 DIAGNOSIS — M5413 Radiculopathy, cervicothoracic region: Secondary | ICD-10-CM | POA: Diagnosis not present

## 2016-06-25 ENCOUNTER — Ambulatory Visit: Payer: BLUE CROSS/BLUE SHIELD | Admitting: Physical Therapy

## 2016-06-25 DIAGNOSIS — M5413 Radiculopathy, cervicothoracic region: Secondary | ICD-10-CM | POA: Diagnosis not present

## 2016-06-25 DIAGNOSIS — M542 Cervicalgia: Secondary | ICD-10-CM | POA: Diagnosis not present

## 2016-06-30 DIAGNOSIS — M542 Cervicalgia: Secondary | ICD-10-CM | POA: Diagnosis not present

## 2016-06-30 DIAGNOSIS — M5413 Radiculopathy, cervicothoracic region: Secondary | ICD-10-CM | POA: Diagnosis not present

## 2016-07-02 ENCOUNTER — Other Ambulatory Visit: Payer: Self-pay | Admitting: *Deleted

## 2016-07-02 DIAGNOSIS — M542 Cervicalgia: Secondary | ICD-10-CM | POA: Diagnosis not present

## 2016-07-02 DIAGNOSIS — M5413 Radiculopathy, cervicothoracic region: Secondary | ICD-10-CM | POA: Diagnosis not present

## 2016-07-02 MED ORDER — MELOXICAM 15 MG PO TABS: 15.0000 mg | ORAL_TABLET | Freq: Every day | ORAL | 3 refills | Status: DC

## 2016-07-06 DIAGNOSIS — M5413 Radiculopathy, cervicothoracic region: Secondary | ICD-10-CM | POA: Diagnosis not present

## 2016-07-06 DIAGNOSIS — M542 Cervicalgia: Secondary | ICD-10-CM | POA: Diagnosis not present

## 2016-07-08 DIAGNOSIS — M5413 Radiculopathy, cervicothoracic region: Secondary | ICD-10-CM | POA: Diagnosis not present

## 2016-07-08 DIAGNOSIS — M542 Cervicalgia: Secondary | ICD-10-CM | POA: Diagnosis not present

## 2016-07-10 DIAGNOSIS — M542 Cervicalgia: Secondary | ICD-10-CM | POA: Diagnosis not present

## 2016-07-10 DIAGNOSIS — M5413 Radiculopathy, cervicothoracic region: Secondary | ICD-10-CM | POA: Diagnosis not present

## 2016-07-13 DIAGNOSIS — M5413 Radiculopathy, cervicothoracic region: Secondary | ICD-10-CM | POA: Diagnosis not present

## 2016-07-13 DIAGNOSIS — M542 Cervicalgia: Secondary | ICD-10-CM | POA: Diagnosis not present

## 2016-07-15 DIAGNOSIS — M5413 Radiculopathy, cervicothoracic region: Secondary | ICD-10-CM | POA: Diagnosis not present

## 2016-07-15 DIAGNOSIS — M542 Cervicalgia: Secondary | ICD-10-CM | POA: Diagnosis not present

## 2016-07-23 ENCOUNTER — Ambulatory Visit (INDEPENDENT_AMBULATORY_CARE_PROVIDER_SITE_OTHER): Payer: BLUE CROSS/BLUE SHIELD | Admitting: Sports Medicine

## 2016-07-23 ENCOUNTER — Ambulatory Visit: Payer: Self-pay

## 2016-07-23 ENCOUNTER — Other Ambulatory Visit: Payer: Self-pay | Admitting: Sports Medicine

## 2016-07-23 ENCOUNTER — Encounter: Payer: Self-pay | Admitting: Sports Medicine

## 2016-07-23 VITALS — BP 125/104 | Ht 69.0 in | Wt 162.0 lb

## 2016-07-23 DIAGNOSIS — M7530 Calcific tendinitis of unspecified shoulder: Secondary | ICD-10-CM | POA: Diagnosis not present

## 2016-07-23 DIAGNOSIS — M5412 Radiculopathy, cervical region: Secondary | ICD-10-CM | POA: Diagnosis not present

## 2016-07-23 DIAGNOSIS — M25511 Pain in right shoulder: Secondary | ICD-10-CM | POA: Diagnosis not present

## 2016-07-23 MED ORDER — NITROGLYCERIN 0.1 MG/HR TD PT24: 0.1000 mg | MEDICATED_PATCH | Freq: Every day | TRANSDERMAL | 2 refills | Status: DC

## 2016-07-23 MED ORDER — NITROGLYCERIN 0.1 MG/HR TD PT24: MEDICATED_PATCH | TRANSDERMAL | 2 refills | Status: DC

## 2016-07-23 NOTE — Assessment & Plan Note (Signed)
Add SST exdrcises  Trial on NTG protocol but stop if any migraines  Consider injection if not improving  Reck 6 wks

## 2016-07-23 NOTE — Patient Instructions (Signed)
.  Nitroglycerin Protocol   Apply 1/4 nitroglycerin patch to affected area daily.  Change position of patch within the affected area every 24 hours.  You may experience a headache during the first 1-2 weeks of using the patch, these should subside.  If you experience headaches after beginning nitroglycerin patch treatment, you may take your preferred over the counter pain reliever.  Another side effect of the nitroglycerin patch is skin irritation or rash related to patch adhesive.  Please notify our office if you develop more severe headaches or rash, and stop the patch.  Tendon healing with nitroglycerin patch may require 12 to 24 weeks depending on the extent of injury.  Men should not use if taking Viagra, Cialis, or Levitra.   Do not use if you have migraines or rosacea.    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

## 2016-07-23 NOTE — Progress Notes (Signed)
CC: RT shoulder pain  Patient going to PT for neck and radicular pain This has helped Playing tennis XR shows some DDD  However RT shoulder pain persistes Bothers her most with overhead in tennis Hurts in certain positions  ROS No cough or sneeze pain No weakness in hand  PE Anxious W F in NAD BP (!) 125/104   Ht 5\' 9"  (1.753 m)   Wt 162 lb (73.5 kg)   BMI 23.92 kg/m   Some trapezius spasm on RT Pain with neck roatation and lat bend  Shoulder:RT Inspection reveals no abnormalities, atrophy or asymmetry. Palpation is normal with no tenderness over AC joint or bicipital groove. ROM is full in all planes. Rotator cuff strength normal throughout. Pain on impingement with Neer and Hawkin's tests, empty can. Speeds and Yergason's tests normal. No labral pathology noted with negative Obrien's, negative clunk and good stability. This is painful to testing Normal scapular function observed. No painful arc and no drop arm sign. 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 Darrick PennaFields, MD

## 2016-07-23 NOTE — Assessment & Plan Note (Signed)
This has improved with therapy Seems that flexeril helps  Cont flexeril Work with PT

## 2016-07-28 DIAGNOSIS — M542 Cervicalgia: Secondary | ICD-10-CM | POA: Diagnosis not present

## 2016-07-28 DIAGNOSIS — M5413 Radiculopathy, cervicothoracic region: Secondary | ICD-10-CM | POA: Diagnosis not present

## 2016-07-30 DIAGNOSIS — M542 Cervicalgia: Secondary | ICD-10-CM | POA: Diagnosis not present

## 2016-07-30 DIAGNOSIS — M5413 Radiculopathy, cervicothoracic region: Secondary | ICD-10-CM | POA: Diagnosis not present

## 2016-08-03 DIAGNOSIS — M542 Cervicalgia: Secondary | ICD-10-CM | POA: Diagnosis not present

## 2016-08-03 DIAGNOSIS — M5413 Radiculopathy, cervicothoracic region: Secondary | ICD-10-CM | POA: Diagnosis not present

## 2016-08-05 DIAGNOSIS — M542 Cervicalgia: Secondary | ICD-10-CM | POA: Diagnosis not present

## 2016-08-05 DIAGNOSIS — M5413 Radiculopathy, cervicothoracic region: Secondary | ICD-10-CM | POA: Diagnosis not present

## 2016-08-06 ENCOUNTER — Other Ambulatory Visit: Payer: Self-pay | Admitting: Obstetrics and Gynecology

## 2016-08-06 ENCOUNTER — Ambulatory Visit: Payer: BLUE CROSS/BLUE SHIELD | Admitting: Sports Medicine

## 2016-08-06 DIAGNOSIS — Z01419 Encounter for gynecological examination (general) (routine) without abnormal findings: Secondary | ICD-10-CM | POA: Diagnosis not present

## 2016-08-06 DIAGNOSIS — Z1231 Encounter for screening mammogram for malignant neoplasm of breast: Secondary | ICD-10-CM

## 2016-08-06 DIAGNOSIS — Z6826 Body mass index (BMI) 26.0-26.9, adult: Secondary | ICD-10-CM | POA: Diagnosis not present

## 2016-08-07 DIAGNOSIS — M5413 Radiculopathy, cervicothoracic region: Secondary | ICD-10-CM | POA: Diagnosis not present

## 2016-08-07 DIAGNOSIS — M542 Cervicalgia: Secondary | ICD-10-CM | POA: Diagnosis not present

## 2016-08-10 DIAGNOSIS — M542 Cervicalgia: Secondary | ICD-10-CM | POA: Diagnosis not present

## 2016-08-10 DIAGNOSIS — M5413 Radiculopathy, cervicothoracic region: Secondary | ICD-10-CM | POA: Diagnosis not present

## 2016-08-14 DIAGNOSIS — M542 Cervicalgia: Secondary | ICD-10-CM | POA: Diagnosis not present

## 2016-08-14 DIAGNOSIS — M5413 Radiculopathy, cervicothoracic region: Secondary | ICD-10-CM | POA: Diagnosis not present

## 2016-08-17 DIAGNOSIS — M5413 Radiculopathy, cervicothoracic region: Secondary | ICD-10-CM | POA: Diagnosis not present

## 2016-08-17 DIAGNOSIS — M542 Cervicalgia: Secondary | ICD-10-CM | POA: Diagnosis not present

## 2016-08-19 DIAGNOSIS — M542 Cervicalgia: Secondary | ICD-10-CM | POA: Diagnosis not present

## 2016-08-19 DIAGNOSIS — M5413 Radiculopathy, cervicothoracic region: Secondary | ICD-10-CM | POA: Diagnosis not present

## 2016-08-20 ENCOUNTER — Ambulatory Visit (INDEPENDENT_AMBULATORY_CARE_PROVIDER_SITE_OTHER): Payer: BLUE CROSS/BLUE SHIELD | Admitting: Sports Medicine

## 2016-08-20 ENCOUNTER — Ambulatory Visit: Payer: Self-pay

## 2016-08-20 ENCOUNTER — Ambulatory Visit: Payer: BLUE CROSS/BLUE SHIELD

## 2016-08-20 VITALS — BP 108/70 | Ht 69.0 in | Wt 162.0 lb

## 2016-08-20 DIAGNOSIS — M79674 Pain in right toe(s): Secondary | ICD-10-CM

## 2016-08-20 DIAGNOSIS — M19071 Primary osteoarthritis, right ankle and foot: Secondary | ICD-10-CM

## 2016-08-20 DIAGNOSIS — M7530 Calcific tendinitis of unspecified shoulder: Secondary | ICD-10-CM

## 2016-08-20 DIAGNOSIS — M25474 Effusion, right foot: Secondary | ICD-10-CM

## 2016-08-20 DIAGNOSIS — M2021 Hallux rigidus, right foot: Secondary | ICD-10-CM | POA: Diagnosis not present

## 2016-08-20 MED ORDER — METHYLPREDNISOLONE ACETATE 40 MG/ML IJ SUSP
20.0000 mg | Freq: Once | INTRAMUSCULAR | Status: AC
  Administered 2016-08-20: 20 mg via INTRA_ARTICULAR

## 2016-08-20 NOTE — Assessment & Plan Note (Signed)
CSi today  This went well under US  We will follow to see how much relief she gets but may want to look at how to protect this on orthtoics

## 2016-08-20 NOTE — Progress Notes (Signed)
   Subjective:    Patient ID: Toni Thompson, female    DOB: 1960/05/24, 56 y.o.   MRN: [id number]  HPI Ms. Lakatos is a 56 yo female with PMH of Hallus Rigidus and presents today for an injection of her right great toe. She reports she had a cheilectomy done in 2014 on this foot which did not relieve her symptoms. (Dr Eulah PontMurphy)  Since then she has been getting great toe injections yearly in [place] by Dr Dareen PianoAnderson. She is going on vacation next week and would like to get this done. Last injection was done in June  of 2017 per patient. She usually gets up to 6 mos or longer of relief. Sandals w poor support can hurt this  Review of Systems Report that sometimes she gets swelling around the area RT shoulder pain that is improving somewhat Doing home exercises for shoulder and limiting tennis      Objective:   Physical Exam Gen: NAD BP 108/70   Ht 5\' 9"  (1.753 m)   Wt 162 lb (73.5 kg)   BMI 23.92 kg/m   Right foot:  Callous of the plantar aspect of the 1st MTP joint noted. No swelling or erythema. Flexion at that right 1st MTP is about 15 degrees (compared to 25 degrees on the left 1st MTP). 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 Fields, MD   Steroid Injection of the Right 1st MTP Joint Under Ultrasound: Consent obtained prior to the procedure The area was cleaned in a sterile fashion first  Using a 30 gauge 0.5 inch needle, 20mg  (0.5cc) depomedrol and 0.5cc lidocaine 1% without epi was injected to the right 1st MTP joint space.  No complications. Minimal bleeding.     Assessment & Plan:   Hallus Rigidus of the Right    - 1st MTP joint injection completed under ultrasound   Palma HolterKanishka G Mustaf Antonacci, MD PGY 2 Family Medicine   I observed and examined the patient with the resident and agree with assessment and plan.  Note reviewed and modified by me. Enid BaasKarl Fields, MD

## 2016-08-20 NOTE — Assessment & Plan Note (Signed)
Clinically her exam is somewhat improved Large calcification noted on last US  Reck in 1 mos and consider barbotage

## 2016-08-21 MED ORDER — METHYLPREDNISOLONE ACETATE 40 MG/ML IJ SUSP
20.0000 mg | Freq: Once | INTRAMUSCULAR | Status: AC
  Administered 2016-08-20: 20 mg via INTRA_ARTICULAR

## 2016-08-21 NOTE — Addendum Note (Signed)
Addended by: Annita BrodMOORE, Nicholus Chandran C on: 08/21/2016 10:09 AM   Modules accepted: Orders

## 2016-09-07 ENCOUNTER — Ambulatory Visit
Admission: RE | Admit: 2016-09-07 | Discharge: 2016-09-07 | Disposition: A | Discharge status: Home / Self Care | Payer: BLUE CROSS/BLUE SHIELD | Source: Ambulatory Visit | Attending: Obstetrics and Gynecology | Admitting: Obstetrics and Gynecology

## 2016-09-07 DIAGNOSIS — Z1231 Encounter for screening mammogram for malignant neoplasm of breast: Secondary | ICD-10-CM | POA: Diagnosis not present

## 2016-09-10 ENCOUNTER — Ambulatory Visit: Payer: BLUE CROSS/BLUE SHIELD | Admitting: Sports Medicine

## 2016-09-24 ENCOUNTER — Ambulatory Visit (INDEPENDENT_AMBULATORY_CARE_PROVIDER_SITE_OTHER): Payer: BLUE CROSS/BLUE SHIELD | Admitting: Sports Medicine

## 2016-09-24 DIAGNOSIS — M5412 Radiculopathy, cervical region: Secondary | ICD-10-CM | POA: Diagnosis not present

## 2016-09-24 DIAGNOSIS — M7530 Calcific tendinitis of unspecified shoulder: Secondary | ICD-10-CM | POA: Diagnosis not present

## 2016-09-24 NOTE — Progress Notes (Signed)
   HPI  CC: Right shoulder pain follow-up Patient here for follow-up on her right sided shoulder pain. She was seen approximately 6 weeks ago and provided home exercise strengthening program and nitroglycerin patches. She states that she has been mild to moderately compliant with her home exercises and the nitroglycerin patches. However, her biceps tendon pain has completely resolved and her overhead shoulder pain has improved dramatically. She states that she still has some achiness and discomfort especially while playing tennis. She continues to work with a Engineer, manufacturing systemstennis coach and physical therapist outside of the office for these issues. She denies any new injury or trauma. No new weakness, numbness, or paresthesias.  Medications/Interventions Tried: Nitro patches, home PT, formal PT  See HPI and/or previous note for associated ROS.  Objective: BP 118/82   Ht 5\' 9"  (1.753 m)   Wt 165 lb (74.8 kg)   BMI 24.37 kg/m  Gen: Right-Hand Dominant. NAD, well groomed, a/o x3, normal affect.  CV: Well-perfused. Warm.  Resp: Non-labored.  Neuro: Sensation intact throughout. No gross coordination deficits.  Gait: Nonpathologic posture, unremarkable stride without signs of limp or balance issues. Shoulder, Right: TTP noted at the anterior and lateral aspect. No evidence of bony deformity, asymmetry, or muscle atrophy; No tenderness over long head of biceps (bicipital groove). No [facility]. Full active and passive range of motion (180 flex Elisabeth Most/60Ext /150Abd /90ER /70IR), Thumb to T12 without significant tenderness. Strength 5/5 throughout. No abnormal scapular function observed. Sensation intact. Peripheral pulses intact.  Special Tests:   - Crossarm test: NEG   - Empty can: positive for pain only (but improved per patient)   - Neer test: Positive   - Obrien's test: Positive   - Yergason's: NEG   - Speeds test: Positive  ULTRASOUND: Shoulder, Right  Diagnostic incomplete ultrasound scan obtained  of patient's right shoulder.  - No obvious evidence of bony deformity or osteophyte development appreciated.  - Long head of the biceps tendon: No evidence of tendon thickening, calcification, subluxation, or tearing in short or long axis views. No edema or bullseye sign. Very swallow bicipital groove noted. - Subscapularis tendon: complete visualization across the width of the insertion point yielded no evidence of tendon thickening, calcification, or tears in the long axis view.  - Supraspinatus tendon: complete visualization across the width of the insertion point yielded evidence of tendon calcification which seemed to have improved dramatically from last scan. No evidence of suspected splitting/tear from last scan. No evidence of bursal inflammation appreciated.  - Infraspinatus and teres minor tendons: visualization across the width of the insertion points yielded no evidence of tendon thickening, calcification, or tears in the long axis view.  IMPRESSION: findings consistent with improved supraspinatus calcific tendinopathy.   Assessment and plan:  Calcific supraspinatus tendinitis Patient is here for follow-up on her right sided calcific supraspinatus tendinopathy. Symptoms have improved from last visit. Ultrasound today yielded reduced calcific changes and were unable to identify the previous suspected split within the tendon on the previous scan. Ultimately patient is improved overall. - Continue strengthening exercises. - RICE therapy PRN - Continue nitroglycerin patch (patient was only mildly compliant) - Patient to follow-up in 6-8 weeks if symptoms persist, for repeat ultrasound scan.  Cervical radiculopathy Briefly discussed today. No significant change overall. - Discussed isometric muscle strengthening exercises    Kathee DeltonIan D Mance Vallejo, MD,MS [facility] Fellow 09/24/2016 9:34 AM

## 2016-09-24 NOTE — Assessment & Plan Note (Signed)
Briefly discussed today. No significant change overall. - Discussed isometric muscle strengthening exercises

## 2016-09-24 NOTE — Assessment & Plan Note (Addendum)
Patient is here for follow-up on her right sided calcific supraspinatus tendinopathy. Symptoms have improved from last visit. Ultrasound today yielded reduced calcific changes and were unable to identify the previous suspected split within the tendon on the previous scan. Ultimately patient is improved overall. - Continue strengthening exercises. - RICE therapy PRN - Continue nitroglycerin patch (patient was only mildly compliant) - Patient to follow-up in 6-8 weeks if symptoms persist, for repeat ultrasound scan.

## 2016-10-07 DIAGNOSIS — R6884 Jaw pain: Secondary | ICD-10-CM | POA: Diagnosis not present

## 2016-10-13 DIAGNOSIS — R6884 Jaw pain: Secondary | ICD-10-CM | POA: Diagnosis not present

## 2016-10-16 DIAGNOSIS — R6884 Jaw pain: Secondary | ICD-10-CM | POA: Diagnosis not present

## 2016-10-23 DIAGNOSIS — R6884 Jaw pain: Secondary | ICD-10-CM | POA: Diagnosis not present

## 2016-10-29 ENCOUNTER — Ambulatory Visit (INDEPENDENT_AMBULATORY_CARE_PROVIDER_SITE_OTHER): Payer: BLUE CROSS/BLUE SHIELD | Admitting: Sports Medicine

## 2016-10-29 DIAGNOSIS — M7530 Calcific tendinitis of unspecified shoulder: Secondary | ICD-10-CM | POA: Diagnosis not present

## 2016-10-29 DIAGNOSIS — M545 Low back pain: Secondary | ICD-10-CM | POA: Diagnosis not present

## 2016-10-29 DIAGNOSIS — M542 Cervicalgia: Secondary | ICD-10-CM

## 2016-10-29 DIAGNOSIS — G8929 Other chronic pain: Secondary | ICD-10-CM | POA: Diagnosis not present

## 2016-10-29 DIAGNOSIS — M26609 Unspecified temporomandibular joint disorder, unspecified side: Secondary | ICD-10-CM | POA: Insufficient documentation

## 2016-10-29 NOTE — Assessment & Plan Note (Signed)
Continues with spasm at times  Try to sue flexeril at night for at least 1 week and if helpful continue  With LBP and neck pain I think she should try Pilates and see if we can identify triggers and muscle imbalances  Tiail PT pilates  Reck 3 mos

## 2016-10-29 NOTE — Assessment & Plan Note (Signed)
This has definitely improved  Keep up some periodic RC exercises

## 2016-10-29 NOTE — Progress Notes (Signed)
CC: pain shoulder, hip and neck  RT calcific supraspinatus tendinopathy; this is doing better;  Able to play tennis now. No night pain.  In PT treatment for TMJ syndrome Part of that is upper shoulder work Having more spasm over RT trapezius Uses neck pillow at night Has changed desk and posture  Now some low back pain into RT hip She in past noted when favoring RT great toe CSI this time did not completely relieve pain  ROS No sciatic sxs No radicular sxs into arm Both knees click and sometimes hurt with rotation Atypical rxn to most meds with stimulation and wakefulness  PE Somewhat anxious W F in NAD BP 112/68   Ht 5\' 9"  (1.753 m)   Wt 165 lb (74.8 kg)   BMI 24.37 kg/m   TMJs both show abnormal motion Trapezius spasm on RT RC testing of shoulders reveals improved strength Normal ROM No impingement  RT hip with normal ROM Tight on testing FABER RT > LT Strength is good

## 2016-10-29 NOTE — Assessment & Plan Note (Signed)
Flared by work at [employer] Posture issues seem to affect this No discogenic features

## 2016-10-30 DIAGNOSIS — R6884 Jaw pain: Secondary | ICD-10-CM | POA: Diagnosis not present

## 2016-11-06 DIAGNOSIS — R6884 Jaw pain: Secondary | ICD-10-CM | POA: Diagnosis not present

## 2016-12-08 DIAGNOSIS — H04129 Dry eye syndrome of unspecified lacrimal gland: Secondary | ICD-10-CM | POA: Diagnosis not present

## 2016-12-17 DIAGNOSIS — R6884 Jaw pain: Secondary | ICD-10-CM | POA: Diagnosis not present

## 2016-12-24 DIAGNOSIS — R6884 Jaw pain: Secondary | ICD-10-CM | POA: Diagnosis not present

## 2016-12-31 DIAGNOSIS — R6884 Jaw pain: Secondary | ICD-10-CM | POA: Diagnosis not present

## 2017-03-04 DIAGNOSIS — R6884 Jaw pain: Secondary | ICD-10-CM | POA: Diagnosis not present

## 2017-03-18 DIAGNOSIS — R6884 Jaw pain: Secondary | ICD-10-CM | POA: Diagnosis not present

## 2017-04-01 DIAGNOSIS — R6884 Jaw pain: Secondary | ICD-10-CM | POA: Diagnosis not present

## 2017-04-02 DIAGNOSIS — D2261 Melanocytic nevi of right upper limb, including shoulder: Secondary | ICD-10-CM | POA: Diagnosis not present

## 2017-04-15 DIAGNOSIS — R6884 Jaw pain: Secondary | ICD-10-CM | POA: Diagnosis not present

## 2017-04-29 DIAGNOSIS — R6884 Jaw pain: Secondary | ICD-10-CM | POA: Diagnosis not present

## 2017-05-06 DIAGNOSIS — R6884 Jaw pain: Secondary | ICD-10-CM | POA: Diagnosis not present

## 2017-05-18 DIAGNOSIS — Z0000 Encounter for general adult medical examination without abnormal findings: Secondary | ICD-10-CM | POA: Diagnosis not present

## 2017-05-18 DIAGNOSIS — H9313 Tinnitus, bilateral: Secondary | ICD-10-CM | POA: Diagnosis not present

## 2017-05-18 DIAGNOSIS — Z79899 Other long term (current) drug therapy: Secondary | ICD-10-CM | POA: Diagnosis not present

## 2017-05-18 DIAGNOSIS — E785 Hyperlipidemia, unspecified: Secondary | ICD-10-CM | POA: Diagnosis not present

## 2017-05-18 DIAGNOSIS — M545 Low back pain: Secondary | ICD-10-CM | POA: Diagnosis not present

## 2017-05-18 DIAGNOSIS — E559 Vitamin D deficiency, unspecified: Secondary | ICD-10-CM | POA: Diagnosis not present

## 2017-05-20 DIAGNOSIS — R6884 Jaw pain: Secondary | ICD-10-CM | POA: Diagnosis not present

## 2017-06-03 DIAGNOSIS — R6884 Jaw pain: Secondary | ICD-10-CM | POA: Diagnosis not present

## 2017-06-23 DIAGNOSIS — M2021 Hallux rigidus, right foot: Secondary | ICD-10-CM | POA: Diagnosis not present

## 2017-06-23 DIAGNOSIS — M79671 Pain in right foot: Secondary | ICD-10-CM | POA: Diagnosis not present

## 2017-08-02 DIAGNOSIS — J029 Acute pharyngitis, unspecified: Secondary | ICD-10-CM | POA: Diagnosis not present

## 2017-08-02 DIAGNOSIS — J302 Other seasonal allergic rhinitis: Secondary | ICD-10-CM | POA: Diagnosis not present

## 2017-08-09 ENCOUNTER — Other Ambulatory Visit: Payer: Self-pay | Admitting: Obstetrics and Gynecology

## 2017-08-09 DIAGNOSIS — Z1231 Encounter for screening mammogram for malignant neoplasm of breast: Secondary | ICD-10-CM

## 2017-08-10 DIAGNOSIS — Z6827 Body mass index (BMI) 27.0-27.9, adult: Secondary | ICD-10-CM | POA: Diagnosis not present

## 2017-08-10 DIAGNOSIS — Z01419 Encounter for gynecological examination (general) (routine) without abnormal findings: Secondary | ICD-10-CM | POA: Diagnosis not present

## 2017-09-14 ENCOUNTER — Ambulatory Visit
Admission: RE | Admit: 2017-09-14 | Discharge: 2017-09-14 | Disposition: A | Discharge status: Home / Self Care | Payer: BLUE CROSS/BLUE SHIELD | Source: Ambulatory Visit | Attending: Obstetrics and Gynecology | Admitting: Obstetrics and Gynecology

## 2017-09-14 DIAGNOSIS — Z1231 Encounter for screening mammogram for malignant neoplasm of breast: Secondary | ICD-10-CM | POA: Diagnosis not present

## 2017-11-04 DIAGNOSIS — H1045 Other chronic allergic conjunctivitis: Secondary | ICD-10-CM | POA: Diagnosis not present

## 2017-12-02 ENCOUNTER — Ambulatory Visit (INDEPENDENT_AMBULATORY_CARE_PROVIDER_SITE_OTHER): Payer: BLUE CROSS/BLUE SHIELD | Admitting: Sports Medicine

## 2017-12-02 ENCOUNTER — Encounter: Payer: Self-pay | Admitting: Sports Medicine

## 2017-12-02 ENCOUNTER — Ambulatory Visit: Payer: Self-pay

## 2017-12-02 VITALS — BP 114/62 | Ht 69.0 in | Wt 173.0 lb

## 2017-12-02 DIAGNOSIS — M2012 Hallux valgus (acquired), left foot: Secondary | ICD-10-CM

## 2017-12-02 DIAGNOSIS — M79674 Pain in right toe(s): Secondary | ICD-10-CM | POA: Diagnosis not present

## 2017-12-02 DIAGNOSIS — M2011 Hallux valgus (acquired), right foot: Secondary | ICD-10-CM

## 2017-12-02 DIAGNOSIS — Q667 Congenital pes cavus, unspecified foot: Secondary | ICD-10-CM

## 2017-12-02 MED ORDER — METHYLPREDNISOLONE ACETATE 40 MG/ML IJ SUSP
40.0000 mg | Freq: Once | INTRAMUSCULAR | Status: AC
  Administered 2017-12-02: 40 mg via INTRA_ARTICULAR

## 2017-12-02 NOTE — Assessment & Plan Note (Addendum)
INJECTION: Patient gave verbal informed consent for the procedure. Appropriate time out was taken. Area prepped and draped in usual sterile fashion. 1 cc of methylprednisolone 40 mg/ml plus  1 cc of 1% lidocaine without epinephrine was injected into the R 1st MTP joint using ultrasound guidance. The patient tolerated the procedure well. There were no complications. Post procedure instructions were given. Images saved to PACs.

## 2017-12-02 NOTE — Progress Notes (Signed)
Toni Thompson - 57 y.o. female MRN [id number]  Date of birth: 28-Mar-1960   Chief complaint: B/l midfoot pain, R great toe pain  SUBJECTIVE:    History of present illness: Patient is a 57 year old female who presents today with a chief complaint of bilateral midfoot pain.  She expresses the desire to have custom orthotics built for her today.  Her current orthotics are approximately 65-1/56 years old.  She states that they are starting to wear down and she would like a new set made for her.  She has the diagnosis of pes cavus as well as hallux valgus bilaterally.  The orthotics have eliminated her foot pain.  Today she is having 3-4 out of 10 of pain located in the medial longitudinal arch bilaterally.  She is also having 6 out of 10 right great toe pain.  Pain is also associated with swelling.    She has a history of surgery on her first MTP joint with persistent pain afterwards.  She has seen several specialists including Dr. Dareen Piano for this without any significant solutions.  Her last injection of her first MTP joint was over 4-5 months ago.  She reports at least 3 months of relief with these injections and would like a repeat one today.  Denies any new symptoms of numbness or tingling of her extremities.  No foot or ankle weakness.  She is stating she is having right hip weakness.  She has underwent physical therapy for this in the past and does some hip abduction exercises for this.  No new injuries that she remembers.  Denies fevers, chills or night sweats.  Denies any rash or ecchymoses.  Of note the patient is going on a cruise to [place] in 2 weeks and will be very mobile during this.   Review of systems:  As stated above   Interval past medical history, surgical history, family history, and social history obtained and are unchanged.   Pertinent medical history includes: Hallux valgus bilaterally, Pes cavus.  Pertinent surgical history includes: cheilectomy in 2014.  Medications reviewed  and unchanged. Allergies: No known drug allergies  OBJECTIVE:  Physical exam: Vital signs are reviewed. BP 114/62   Ht 5\' 9"  (1.753 m)   Wt 173 lb (78.5 kg)   BMI 25.55 kg/m   Gen.: Alert, oriented, appears stated age, in no apparent distress HEENT: Moist oral mucosa Respiratory: Normal respirations, able to speak in full sentences Cardiac: Regular rate, distal pulses 2+ Integumentary: No rashes or ecchymosis Neurologic:  Sensation is intact light touch L4-S1 Gait: Significant pronation with ambulation.  She does have right-sided hip drop and an elevated left shoulder.  Pronation is significantly corrected with new orthotics. Psych: Normal affect, mood is described as good, very talkative Musculoskeletal: Inspection of the bilateral feet demonstrate Pez cavus deformities.  She has mild tenderness to palpation in her medial longitudinal arch which is large in nature.  She has significant hallux valgus bilaterally with pain upon palpation of her right first MTP joint.  There is trace swelling at this joint.  She has decreased range of motion in dorsiflexion plantarflexion at her first MTP joint.  Strength testing is 5 out of 5 in EHL and 5 out of 5 and FHL.  She is neurovascularly intact in this extremity.  Testing of strength of the right hip demonstrates plus 4 out of 5 hip abductors, 5 out of 5 hip flexors, 5 out of 5 hip extensors.    ASSESSMENT & PLAN: Hallux valgus INJECTION:  Patient gave verbal informed consent for the procedure. Appropriate time out was taken. Area prepped and draped in usual sterile fashion. 1 cc of methylprednisolone 40 mg/ml plus  1 cc of 1% lidocaine without epinephrine was injected into the R 1st MTP joint using ultrasound guidance. The patient tolerated the procedure well. There were no complications. Post procedure instructions were given. Images saved to PACs.   Pes cavus Patient was fitted for a : standard, cushioned, semi-rigid orthotic. The orthotic was  heated and afterward the patient stood on the orthotic blank positioned on the orthotic stand. The patient was positioned in subtalar neutral position and 10 degrees of ankle dorsiflexion in a weight bearing stance. After completion of molding, a stable base was applied to the orthotic blank. The blank was ground to a stable position for weight bearing. Size: 9 Base: EVA Posting: 1st ray Poron on R Additional orthotic padding: none  Total time spent with the patient was 30 minutes with greater than 50% of the time spent in face-to-face consultation doing gait analysis, construction, and customization of custom orthotics.   Orders Placed This Encounter  Procedures  . [place] COMPLETE JOINT SPACE STRUCTURES LOW RIGHT    Standing Status:   Future    Number of Occurrences:   1    Standing Expiration Date:   02/01/2019    Order Specific Question:   Reason for Exam (SYMPTOM  OR DIAGNOSIS REQUIRED)    Answer:   right toe pain    Order Specific Question:   Preferred imaging location?    Answer:   Internal    Meds ordered this encounter  Medications  . methylPREDNISolone acetate (DEPO-MEDROL) injection 40 mg      Toni Messing, DO Sports Medicine Fellow [place]  I observed and examined the patient with the [facility] Fellow and agree with assessment and plan.  I performed the US guided injection.  Note reviewed and modified by me. Toni Thompson

## 2017-12-02 NOTE — Assessment & Plan Note (Signed)
Patient was fitted for a : standard, cushioned, semi-rigid orthotic. The orthotic was heated and afterward the patient stood on the orthotic blank positioned on the orthotic stand. The patient was positioned in subtalar neutral position and 10 degrees of ankle dorsiflexion in a weight bearing stance. After completion of molding, a stable base was applied to the orthotic blank. The blank was ground to a stable position for weight bearing. Size: 9 Base: EVA Posting: 1st ray Poron on R Additional orthotic padding: none  Total time spent with the patient was 30 minutes with greater than 50% of the time spent in face-to-face consultation doing gait analysis, construction, and customization of custom orthotics.

## 2018-03-15 DIAGNOSIS — K219 Gastro-esophageal reflux disease without esophagitis: Secondary | ICD-10-CM | POA: Diagnosis not present

## 2018-03-15 DIAGNOSIS — Z1211 Encounter for screening for malignant neoplasm of colon: Secondary | ICD-10-CM | POA: Diagnosis not present

## 2018-03-15 DIAGNOSIS — K5904 Chronic idiopathic constipation: Secondary | ICD-10-CM | POA: Diagnosis not present

## 2018-05-13 DIAGNOSIS — Z1211 Encounter for screening for malignant neoplasm of colon: Secondary | ICD-10-CM | POA: Diagnosis not present

## 2018-05-13 DIAGNOSIS — Z8371 Family history of colonic polyps: Secondary | ICD-10-CM | POA: Diagnosis not present

## 2018-05-13 DIAGNOSIS — K5730 Diverticulosis of large intestine without perforation or abscess without bleeding: Secondary | ICD-10-CM | POA: Diagnosis not present

## 2018-06-08 ENCOUNTER — Other Ambulatory Visit: Payer: Self-pay | Admitting: Obstetrics and Gynecology

## 2018-06-08 DIAGNOSIS — Z1231 Encounter for screening mammogram for malignant neoplasm of breast: Secondary | ICD-10-CM

## 2018-08-06 IMAGING — CR DG CERVICAL SPINE COMPLETE 4+V
6 series · 6 of 6 positions shown · non-contrast
Comparison: None.

CLINICAL DATA: Progressive right-sided neck pain and arm pain for
several months

EXAM:
CERVICAL SPINE - COMPLETE 4+ VIEW

[w cervical spine lat]
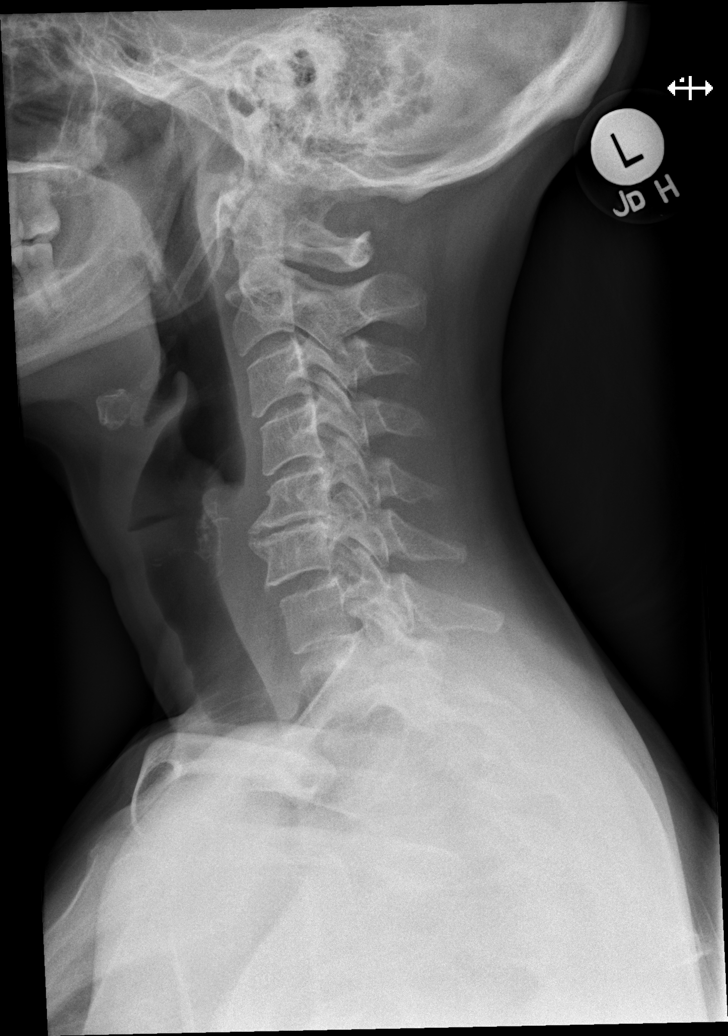

[w cervical spine ap_obl (1 of 2)]
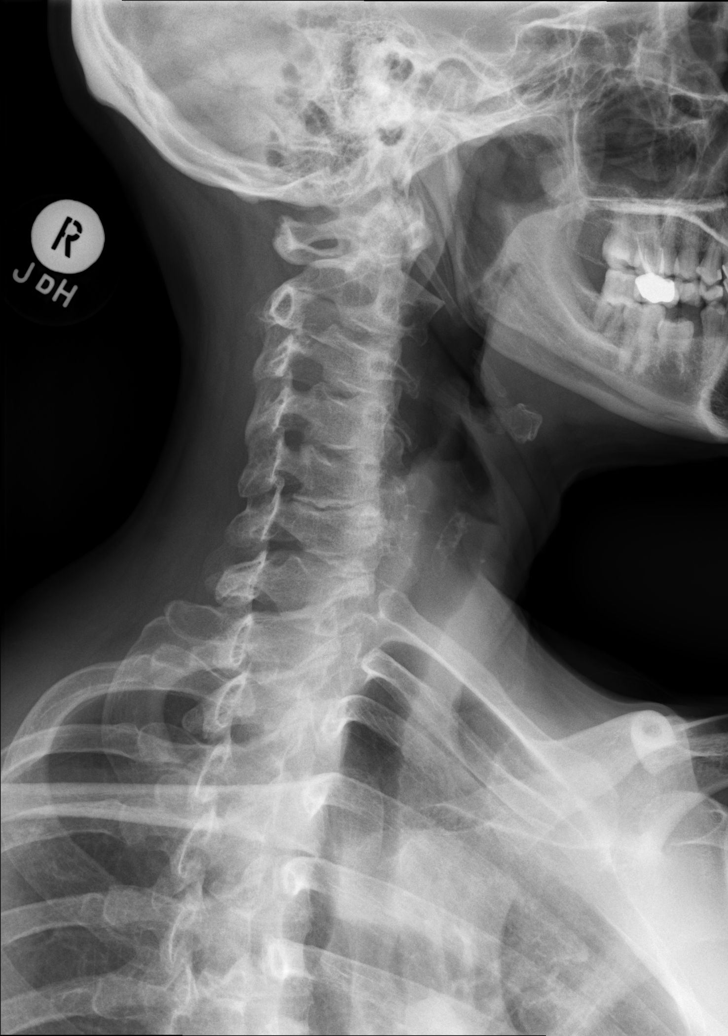

[w cervical spine ap_obl (2 of 2)]
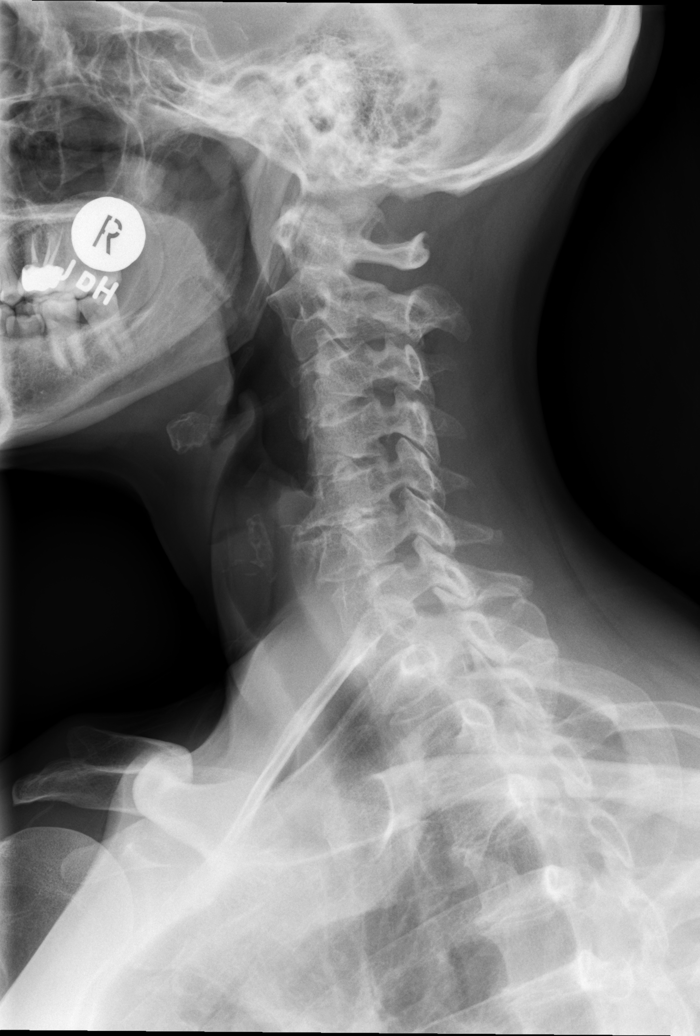

[w cervical spine ap]
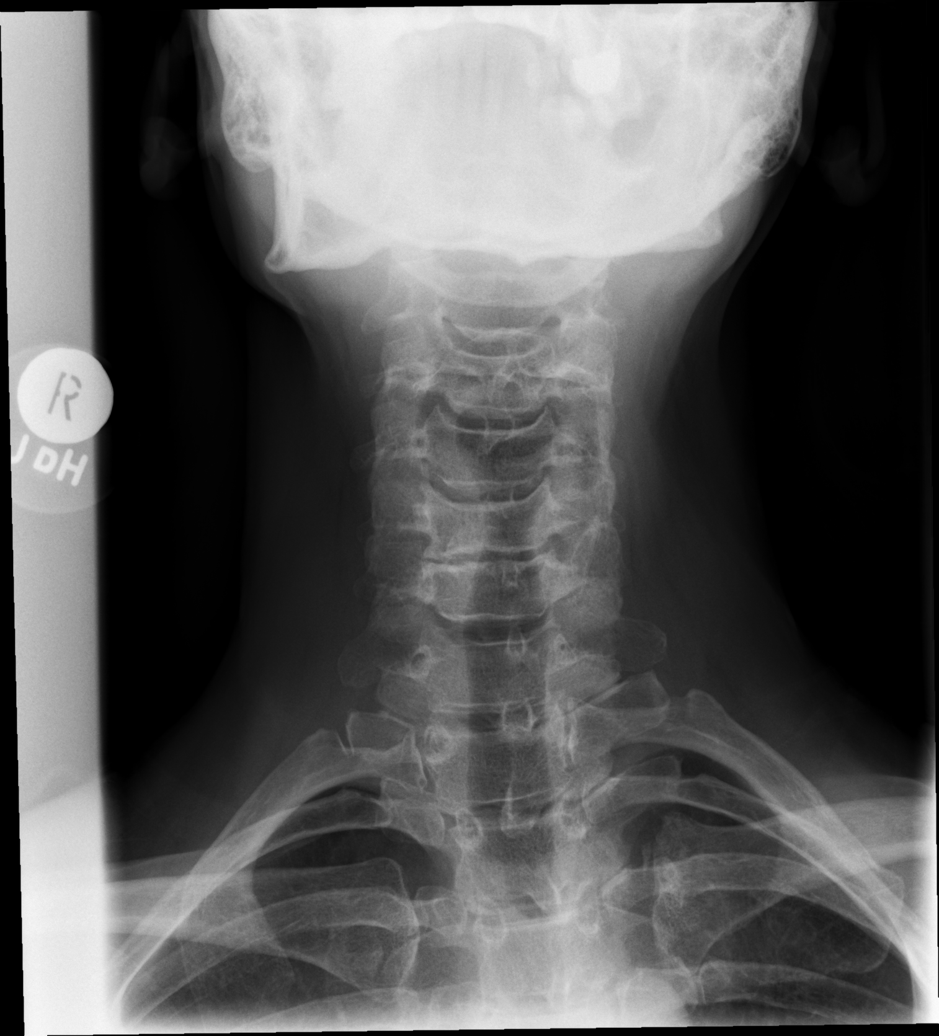

[t cervical spine odontoid (1 of 2)]
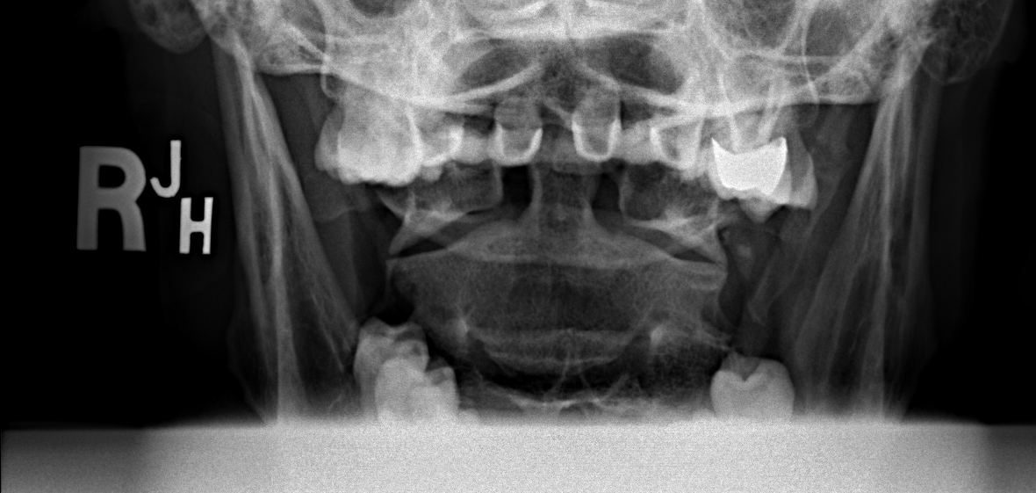

[t cervical spine odontoid (2 of 2)]
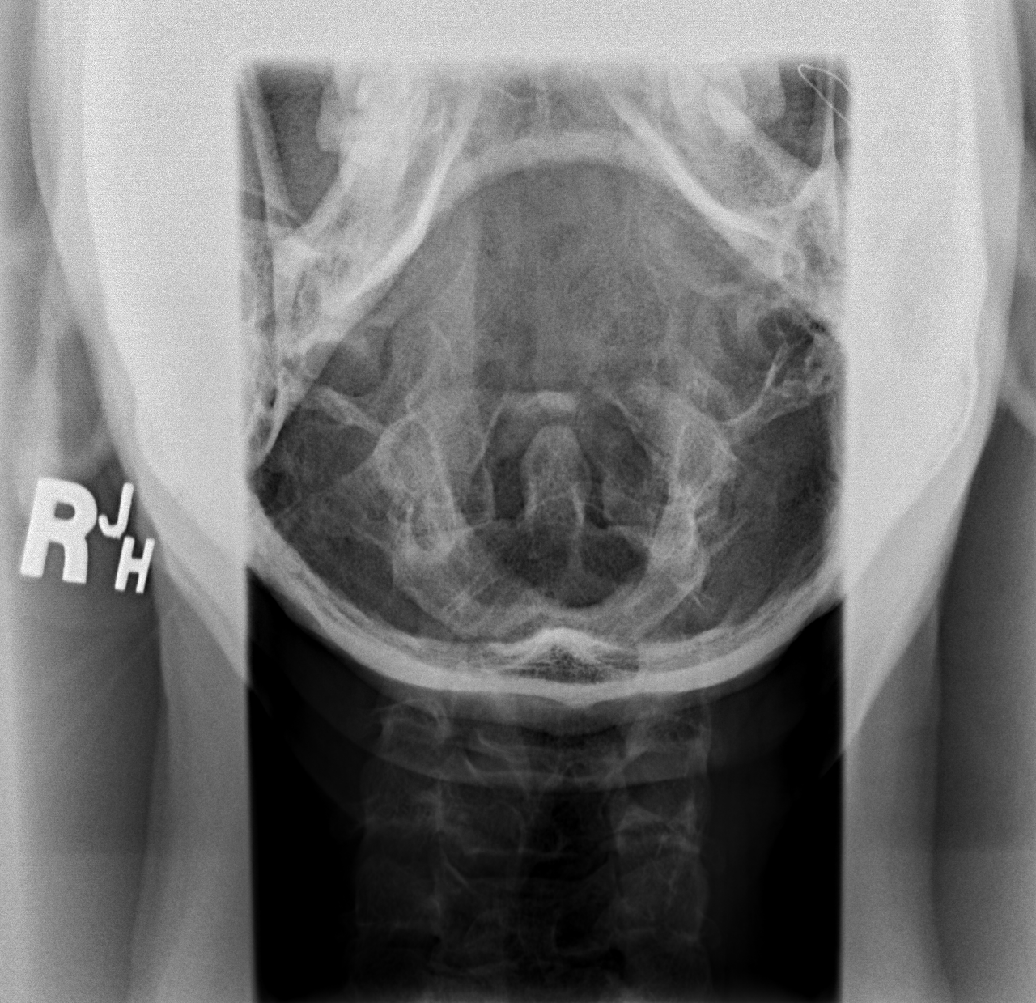

[6 of 6 positions shown; findings below may reference images not displayed]

FINDINGS: Dens and lateral masses are within normal limits. There is
straightening of the cervical spine. Prevertebral soft tissue
thickness appears normal.

Moderate to marked degenerative disc changes at C5-C6. No fracture.
Bilateral foraminal narrowing, most notable at C5-C6. Lung apices
clear.
IMPRESSION: 1. No acute osseous abnormality
2. Moderate-to-marked degenerative disc changes at C5-C6.

## 2018-08-18 DIAGNOSIS — H2513 Age-related nuclear cataract, bilateral: Secondary | ICD-10-CM | POA: Diagnosis not present

## 2018-08-18 DIAGNOSIS — H52203 Unspecified astigmatism, bilateral: Secondary | ICD-10-CM | POA: Diagnosis not present

## 2018-08-18 DIAGNOSIS — H25013 Cortical age-related cataract, bilateral: Secondary | ICD-10-CM | POA: Diagnosis not present

## 2018-08-18 DIAGNOSIS — H04123 Dry eye syndrome of bilateral lacrimal glands: Secondary | ICD-10-CM | POA: Diagnosis not present

## 2018-09-05 DIAGNOSIS — N951 Menopausal and female climacteric states: Secondary | ICD-10-CM | POA: Diagnosis not present

## 2018-09-05 DIAGNOSIS — Z6828 Body mass index (BMI) 28.0-28.9, adult: Secondary | ICD-10-CM | POA: Diagnosis not present

## 2018-09-05 DIAGNOSIS — Z01419 Encounter for gynecological examination (general) (routine) without abnormal findings: Secondary | ICD-10-CM | POA: Diagnosis not present

## 2018-09-13 DIAGNOSIS — Z131 Encounter for screening for diabetes mellitus: Secondary | ICD-10-CM | POA: Diagnosis not present

## 2018-09-13 DIAGNOSIS — Z130 Encounter for screening for diseases of the blood and blood-forming organs and certain disorders involving the immune mechanism: Secondary | ICD-10-CM | POA: Diagnosis not present

## 2018-09-13 DIAGNOSIS — Z13228 Encounter for screening for other metabolic disorders: Secondary | ICD-10-CM | POA: Diagnosis not present

## 2018-09-13 DIAGNOSIS — Z1329 Encounter for screening for other suspected endocrine disorder: Secondary | ICD-10-CM | POA: Diagnosis not present

## 2018-09-13 DIAGNOSIS — Z13220 Encounter for screening for lipoid disorders: Secondary | ICD-10-CM | POA: Diagnosis not present

## 2018-09-16 ENCOUNTER — Ambulatory Visit
Admission: RE | Admit: 2018-09-16 | Discharge: 2018-09-16 | Disposition: A | Discharge status: Home / Self Care | Payer: BC Managed Care – PPO | Source: Ambulatory Visit | Attending: Obstetrics and Gynecology | Admitting: Obstetrics and Gynecology

## 2018-09-16 ENCOUNTER — Other Ambulatory Visit: Payer: Self-pay

## 2018-09-16 DIAGNOSIS — Z1231 Encounter for screening mammogram for malignant neoplasm of breast: Secondary | ICD-10-CM | POA: Diagnosis not present

## 2018-09-20 DIAGNOSIS — I788 Other diseases of capillaries: Secondary | ICD-10-CM | POA: Diagnosis not present

## 2018-09-20 DIAGNOSIS — D2261 Melanocytic nevi of right upper limb, including shoulder: Secondary | ICD-10-CM | POA: Diagnosis not present

## 2018-09-20 DIAGNOSIS — D692 Other nonthrombocytopenic purpura: Secondary | ICD-10-CM | POA: Diagnosis not present

## 2018-09-20 DIAGNOSIS — D2262 Melanocytic nevi of left upper limb, including shoulder: Secondary | ICD-10-CM | POA: Diagnosis not present

## 2018-12-28 ENCOUNTER — Ambulatory Visit (INDEPENDENT_AMBULATORY_CARE_PROVIDER_SITE_OTHER): Payer: BC Managed Care – PPO | Admitting: Family Medicine

## 2018-12-28 ENCOUNTER — Other Ambulatory Visit: Payer: Self-pay

## 2018-12-28 VITALS — BP 122/78 | Ht 69.0 in | Wt 175.0 lb

## 2018-12-28 DIAGNOSIS — M79674 Pain in right toe(s): Secondary | ICD-10-CM

## 2018-12-28 MED ORDER — METHYLPREDNISOLONE ACETATE 40 MG/ML IJ SUSP
20.0000 mg | Freq: Once | INTRAMUSCULAR | Status: AC
  Administered 2018-12-28: 20 mg via INTRA_ARTICULAR

## 2018-12-28 NOTE — Patient Instructions (Signed)
Your pain is due to arthritis. These are the different medications you can take for this: Tylenol 500mg  1-2 tabs three times a day for pain. Capsaicin, aspercreme, or biofreeze topically up to four times a day may also help with pain. Some supplements that may help for arthritis: Boswellia extract, curcumin, pycnogenol Aleve 1-2 tabs twice a day with food Cortisone injections are an option - you were given this today. Sometimes a hard sole shoe can help if this is flared up. Shoe inserts with good arch support may be helpful - continue wearing this. Ice 15 minutes at a time 3-4 times a day as needed to help with pain. Follow up with me as needed.

## 2018-12-30 ENCOUNTER — Encounter: Payer: Self-pay | Admitting: Family Medicine

## 2018-12-30 NOTE — Progress Notes (Signed)
PCP: Gweneth Dimitri, MD  Subjective:   HPI: Patient is a 58 y.o. female here for right toe injection.  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 Previously had cheilectomy by Dr. Eulah Pont without much benefit. She's wearing orthotics which help and steroid injection provided roughly every year which helps as well. Current pain has come on for about a week and been more severe. No skin changes, numbness. No new injuries.  Past Medical History:  Diagnosis Date  . Contact lens/glasses fitting    wears contacts or glasses    No current outpatient medications on file prior to visit.   No current facility-administered medications on file prior to visit.     Past Surgical History:  Procedure Laterality Date  . CHEILECTOMY Right 01/12/2013   Procedure: RIGHT HALLUX RIGIDUS WITH CHEILECTOMY 1ST METATARSOPHALANGEAL;  Surgeon: Loreta Ave, MD;  Location: [facility];  Service: Orthopedics;  Laterality: Right;  . COLONOSCOPY    . CRANIOTOMY FOR TUMOR     benign age 70  . DILATION AND CURETTAGE OF UTERUS      No Known Allergies  Social History   Socioeconomic History  . Marital status: Single    Spouse name: Not on file  . Number of children: Not on file  . Years of education: Not on file  . Highest education level: Not on file  Occupational History  . Not on file  Social Needs  . Financial resource strain: Not on file  . Food insecurity    Worry: Not on file    Inability: Not on file  . Transportation needs    Medical: Not on file    Non-medical: Not on file  Tobacco Use  . Smoking status: Former Smoker    Quit date: 03/02/2001    Years since quitting: 17.8  . Smokeless tobacco: Never Used  Substance and Sexual Activity  . Alcohol use: Yes    Comment: occ  . Drug use: No  . Sexual activity: Not on file  Lifestyle  . Physical activity    Days per week: Not on file    Minutes per session: Not on file  . Stress: Not on file   Relationships  . Social Musician on phone: Not on file    Gets together: Not on file    Attends religious service: Not on file    Active member of club or organization: Not on file    Attends meetings of clubs or organizations: Not on file    Relationship status: Not on file  . Intimate partner violence    Fear of current or ex partner: Not on file    Emotionally abused: Not on file    Physically abused: Not on file    Forced sexual activity: Not on file  Other Topics Concern  . Not on file  Social History Narrative  . Not on file    History reviewed. No pertinent family history.  BP 122/78   Ht 5\' 9"  (1.753 m)   Wt 175 lb (79.4 kg)   BMI 25.84 kg/m   Review of Systems: See HPI above.     Objective:  Physical Exam:  Gen: NAD, comfortable in exam room  Right foot/ankle: Well healed cheilectomy scar.  No other deformity. FROM ankle but hallux rigidus with minimal 1st MTP motion. TTP mildly over 1st MTP.  No other tenderness. Negative ant drawer and talar tilt.   Thompsons test negative. NV intact  distally.   Assessment & Plan:  1. 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  Continue with orthotics.  Tylenol, topical medications, supplements, aleve reviewed.  Icing as needed.  F/u prn.  After informed written consent timeout was performed.  Patient was seated on exam table.  Area overlying dorsal 1st MTP prepped with alcohol swab then utilizing ultrasound guidance patient's right 1st MTP was injected with 0.5:0.10mL bupivicaine: depomedrol.  Patient tolerated procedure well without immediate complications.

## 2019-01-10 ENCOUNTER — Other Ambulatory Visit: Payer: BC Managed Care – PPO | Admitting: Sports Medicine

## 2019-06-05 DIAGNOSIS — H6123 Impacted cerumen, bilateral: Secondary | ICD-10-CM | POA: Diagnosis not present

## 2019-06-05 DIAGNOSIS — J310 Chronic rhinitis: Secondary | ICD-10-CM | POA: Diagnosis not present

## 2019-06-05 DIAGNOSIS — M26621 Arthralgia of right temporomandibular joint: Secondary | ICD-10-CM | POA: Diagnosis not present

## 2019-06-05 DIAGNOSIS — H9313 Tinnitus, bilateral: Secondary | ICD-10-CM | POA: Diagnosis not present

## 2019-07-18 ENCOUNTER — Other Ambulatory Visit: Payer: Self-pay | Admitting: Obstetrics and Gynecology

## 2019-07-18 DIAGNOSIS — Z1231 Encounter for screening mammogram for malignant neoplasm of breast: Secondary | ICD-10-CM

## 2019-07-26 DIAGNOSIS — Z0000 Encounter for general adult medical examination without abnormal findings: Secondary | ICD-10-CM | POA: Diagnosis not present

## 2019-07-26 DIAGNOSIS — Z79899 Other long term (current) drug therapy: Secondary | ICD-10-CM | POA: Diagnosis not present

## 2019-07-26 DIAGNOSIS — E559 Vitamin D deficiency, unspecified: Secondary | ICD-10-CM | POA: Diagnosis not present

## 2019-07-26 DIAGNOSIS — E785 Hyperlipidemia, unspecified: Secondary | ICD-10-CM | POA: Diagnosis not present

## 2019-08-15 ENCOUNTER — Ambulatory Visit: Payer: BC Managed Care – PPO | Admitting: Sports Medicine

## 2019-08-15 ENCOUNTER — Other Ambulatory Visit: Payer: Self-pay

## 2019-08-15 DIAGNOSIS — M25512 Pain in left shoulder: Secondary | ICD-10-CM

## 2019-08-15 NOTE — Assessment & Plan Note (Addendum)
Patient with left shoulder pain.  Ultrasound showing fluid above the [facility] joint and some minor calcific changes of supraspinatus. Pain is likely from osteoarthritis of AC joint.  Advised Voltaren gel which can be used at her fitness spot as well.  Advised ice 3 times daily.  Advised to avoid overhead exercises or cross arm exercises.  Follow-up if no improvement.

## 2019-08-15 NOTE — Progress Notes (Signed)
   Toni Thompson is a 59 y.o. female who presents to [facility] today for the following:  Left shoulder pain Patient presenting today with new onset left shoulder pain.  Pain began on Friday and on Friday pain was present even with rest.  Pain feels like it is right over her bone.  States that she has pain when she sleeps on that side.  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  Last swim practice was on Thursday.  Did not feel like it was bothering her on Thursday but did wake up with pain on Friday.  Denies any activity on Friday or Saturday.  Normally does play tennis on Saturday but did not to try and rest.  Did play doubles on Sunday but is mainly right-handed and can do his single-handed service.  Has a spot trip planned on Sunday where she will be doing 1 week of intense fitness so would like to feel better before then.  States that she will go on a trip regardless but needs to know if she needs to modify any of her exercises.  Was icing on Saturday and did take 1 Advil on Saturday.   PMH reviewed. Cervical radiculopathy, lateral epicondylitis of left elbow ROS as above. Medications reviewed.  Exam:  BP 116/80   Ht 5\' 9"  (1.753 m)   Wt 179 lb (81.2 kg)   BMI 26.43 kg/m  Gen: Well NAD MSK: Shoulder: Inspection reveals no obvious deformity, atrophy, or asymmetry. No bruising. No swelling Palpation is normal with TTP over AC joint; no TTP over bicipital groove. Full ROM in flexion, abduction, internal/external rotation NV intact distally Normal scapular function observed. Special Tests:  - Impingement: Neg Hawkins, neers, Pos can sign. - Supraspinatous: Pos empty can.  5/5 strength with resisted flexion at 20 degrees - Infraspinatous/Teres Minor: 5/5 strength with ER - Subscapularis: 5/5 strength with IR - Biceps tendon: Negative Speeds, Yerrgason's  - AC Joint: Negative cross arm - Negative apprehension test - No painful arc and no drop arm sign   MSK  performed by Dr. [place] -fluid above AC joint -minor calcifications of supraspinatus  -no fluid over biceps tendon   Assessment and Plan: 1) Left shoulder pain Patient with left shoulder pain.  Ultrasound showing fluid above the [facility] joint and some minor calcific changes of supraspinatus. Pain is likely from osteoarthritis of AC joint.  Advised Voltaren gel which can be used at her fitness spot as well.  Advised ice 3 times daily.  Advised to avoid overhead exercises or cross arm exercises.  Follow-up if no improvement.   [facility], PGY-3 [facility] Resident 08/15/2019 4:28 PM  Patient seen and evaluated with the resident.  I agree with the above plan of care.  Patient does have some tenderness over the [facility] joint and ultrasound today shows fluid here.  Minimal spurring seen.  Limited view of the rotator cuff, specifically the supraspinatus, shows an intact cuff without evidence of tendinopathy or tearing.  Treatment as above and follow-up for ongoing or recalcitrant issues.

## 2019-09-07 DIAGNOSIS — H25013 Cortical age-related cataract, bilateral: Secondary | ICD-10-CM | POA: Diagnosis not present

## 2019-09-07 DIAGNOSIS — H2513 Age-related nuclear cataract, bilateral: Secondary | ICD-10-CM | POA: Diagnosis not present

## 2019-09-07 DIAGNOSIS — H5212 Myopia, left eye: Secondary | ICD-10-CM | POA: Diagnosis not present

## 2019-09-25 ENCOUNTER — Other Ambulatory Visit: Payer: Self-pay

## 2019-09-25 ENCOUNTER — Ambulatory Visit
Admission: RE | Admit: 2019-09-25 | Discharge: 2019-09-25 | Disposition: A | Discharge status: Home / Self Care | Payer: BC Managed Care – PPO | Source: Ambulatory Visit | Attending: Obstetrics and Gynecology | Admitting: Obstetrics and Gynecology

## 2019-09-25 DIAGNOSIS — Z1231 Encounter for screening mammogram for malignant neoplasm of breast: Secondary | ICD-10-CM

## 2019-10-25 DIAGNOSIS — Z01419 Encounter for gynecological examination (general) (routine) without abnormal findings: Secondary | ICD-10-CM | POA: Diagnosis not present

## 2019-10-25 DIAGNOSIS — Z6826 Body mass index (BMI) 26.0-26.9, adult: Secondary | ICD-10-CM | POA: Diagnosis not present

## 2019-11-24 DIAGNOSIS — L814 Other melanin hyperpigmentation: Secondary | ICD-10-CM | POA: Diagnosis not present

## 2019-11-24 DIAGNOSIS — L821 Other seborrheic keratosis: Secondary | ICD-10-CM | POA: Diagnosis not present

## 2019-11-24 DIAGNOSIS — L819 Disorder of pigmentation, unspecified: Secondary | ICD-10-CM | POA: Diagnosis not present

## 2019-11-24 DIAGNOSIS — D1801 Hemangioma of skin and subcutaneous tissue: Secondary | ICD-10-CM | POA: Diagnosis not present

## 2020-06-20 ENCOUNTER — Other Ambulatory Visit: Payer: Self-pay

## 2020-06-20 ENCOUNTER — Ambulatory Visit (INDEPENDENT_AMBULATORY_CARE_PROVIDER_SITE_OTHER): Payer: BC Managed Care – PPO | Admitting: Sports Medicine

## 2020-06-20 VITALS — BP 110/82 | Ht 69.0 in | Wt 164.0 lb

## 2020-06-20 DIAGNOSIS — M2022 Hallux rigidus, left foot: Secondary | ICD-10-CM

## 2020-06-20 MED ORDER — MELOXICAM 15 MG PO TABS
15.0000 mg | ORAL_TABLET | Freq: Every day | ORAL | 0 refills | Status: DC | PRN
Start: 2020-06-20 — End: 2020-08-07

## 2020-06-21 ENCOUNTER — Encounter: Payer: Self-pay | Admitting: Sports Medicine

## 2020-06-21 NOTE — Progress Notes (Signed)
   Subjective:    Patient ID: Toni Thompson, female    DOB: 03-25-1960, 60 y.o.   MRN: [id number]  HPI chief complaint: Left first toe pain  Very pleasant 60 year old female comes in today complaining of left first toe pain that she localizes to the first MTP joint.  Pain began acutely when getting out of a pool earlier this week.  She hyperextended the toe while getting out.  She has a history of previous right first MTP surgery and still has her cam walker so she wore it for couple of days.  She also started taking some meloxicam and Voltaren gel.  As a result, her symptoms have improved by about 80%.  She has been told in the past that she has hallux rigidus in this toe.  She denies any swelling.  No pain elsewhere in the foot.  Overall, she states that her symptoms have improved by about 80%.  Past medical history reviewed Medications reviewed Allergies reviewed    Review of Systems As above    Objective:   Physical Exam  Well-developed, well-nourished.  No acute distress  Examination of the left foot with attention to the left great toe shows a moderate bunion deformity with moderate hallux rigidus.  No effusion.  No soft tissue swelling.  No significant tenderness to palpation.  Flexor and extensor tendons are intact.  Brisk capillary refill.  Gait is normal without an obvious limp.  Limited MSK ultrasound of the left great toe does show some dorsal spurring but no effusion.  Examination of the right great toe shows a moderate effusion with significant spurring and joint loss.      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  I recommended that she continue on her meloxicam for the next 4 to 5 days.  A prescription was provided for her.  She may continue with her Voltaren gel as well.  She may also increase activity as  tolerated.  If symptoms return, we could consider cortisone injection.  She has had good success with intermittent cortisone injections in her arthritic right great toe, usually performed by Dr. Darrick Penna.

## 2020-07-29 ENCOUNTER — Other Ambulatory Visit: Payer: Self-pay | Admitting: Sports Medicine

## 2020-09-06 ENCOUNTER — Other Ambulatory Visit: Payer: Self-pay | Admitting: Obstetrics and Gynecology

## 2020-09-06 DIAGNOSIS — Z1231 Encounter for screening mammogram for malignant neoplasm of breast: Secondary | ICD-10-CM

## 2020-09-16 DIAGNOSIS — H2513 Age-related nuclear cataract, bilateral: Secondary | ICD-10-CM | POA: Diagnosis not present

## 2020-09-16 DIAGNOSIS — H25013 Cortical age-related cataract, bilateral: Secondary | ICD-10-CM | POA: Diagnosis not present

## 2020-09-16 DIAGNOSIS — H5212 Myopia, left eye: Secondary | ICD-10-CM | POA: Diagnosis not present

## 2020-10-07 DIAGNOSIS — E785 Hyperlipidemia, unspecified: Secondary | ICD-10-CM | POA: Diagnosis not present

## 2020-10-07 DIAGNOSIS — Z0000 Encounter for general adult medical examination without abnormal findings: Secondary | ICD-10-CM | POA: Diagnosis not present

## 2020-10-07 DIAGNOSIS — E559 Vitamin D deficiency, unspecified: Secondary | ICD-10-CM | POA: Diagnosis not present

## 2020-10-09 ENCOUNTER — Other Ambulatory Visit: Payer: Self-pay | Admitting: Family Medicine

## 2020-10-09 DIAGNOSIS — E2839 Other primary ovarian failure: Secondary | ICD-10-CM

## 2020-11-05 ENCOUNTER — Ambulatory Visit
Admission: RE | Admit: 2020-11-05 | Discharge: 2020-11-05 | Disposition: A | Discharge status: Home / Self Care | Payer: BC Managed Care – PPO | Source: Ambulatory Visit | Attending: Obstetrics and Gynecology | Admitting: Obstetrics and Gynecology

## 2020-11-05 ENCOUNTER — Other Ambulatory Visit: Payer: Self-pay

## 2020-11-05 DIAGNOSIS — Z1231 Encounter for screening mammogram for malignant neoplasm of breast: Secondary | ICD-10-CM | POA: Diagnosis not present

## 2020-11-06 ENCOUNTER — Other Ambulatory Visit: Payer: Self-pay

## 2020-11-11 MED ORDER — MELOXICAM 15 MG PO TABS: 15.0000 mg | ORAL_TABLET | Freq: Every day | ORAL | 0 refills | Status: DC | PRN

## 2020-12-10 DIAGNOSIS — Z01419 Encounter for gynecological examination (general) (routine) without abnormal findings: Secondary | ICD-10-CM | POA: Diagnosis not present

## 2020-12-10 DIAGNOSIS — Z6826 Body mass index (BMI) 26.0-26.9, adult: Secondary | ICD-10-CM | POA: Diagnosis not present

## 2020-12-12 ENCOUNTER — Ambulatory Visit (INDEPENDENT_AMBULATORY_CARE_PROVIDER_SITE_OTHER): Payer: BC Managed Care – PPO | Admitting: Sports Medicine

## 2020-12-12 VITALS — BP 128/80 | Ht 68.0 in | Wt 165.0 lb

## 2020-12-12 DIAGNOSIS — M7711 Lateral epicondylitis, right elbow: Secondary | ICD-10-CM

## 2020-12-12 MED ORDER — NITROGLYCERIN 0.2 MG/HR TD PT24: MEDICATED_PATCH | TRANSDERMAL | 1 refills | Status: DC

## 2020-12-12 NOTE — Progress Notes (Signed)
   Subjective:    Patient ID: DIANELY KREHBIEL, female    DOB: 29-Sep-1960, 60 y.o.   MRN: [id number]  HPI chief complaint: Right elbow pain  Venessa is a very pleasant 60 year old female that comes in today complaining of several weeks of lateral right elbow pain.  She thinks her symptoms may have began after she was testing her grip strength on her grip machine.  Shortly thereafter she began to notice pain.  She is very fit and very active.  She enjoys tennis, Pilates, swimming, and strength training.  She stopped playing tennis 5 weeks ago.  She has had some dry needling done recently which has been helpful.  She has tried a body helix compression sleeve with some improvement.  She also tried a counterforce brace which was not helpful.  She has a history of lateral epicondylitis in the same elbow many years ago which eventually improved after several weeks of physical therapy.  She denies pain elsewhere at the elbow.  Interim medical history reviewed Medications reviewed Allergies reviewed    Review of Systems As above    Objective:   Physical Exam  Well-developed, well-nourished.  No acute distress.  Right elbow: Full range of motion.  No effusion.  No soft tissue swelling.  There is tenderness to palpation directly over the lateral epicondyle with reproducible pain with resisted ECRB testing.  Good pulses distally.  Limited MSK of the lateral right elbow does show some mild hypoechoic changes in the deep fibers of the common extensor tendon consistent with a probable small tear here.  No joint effusion.       Assessment & Plan:   Right elbow pain secondary to lateral epicondylitis  Patient is given a home exercise program to do in conjunction with her dry needling.  She will wear her compression sleeve with activity.  I have asked her to continue to avoid tennis but I think she can continue with all other activities including swimming but I want her to avoid any sort of exercise  that involves repetitive wrist flexion and extension.  We will also start topical nitroglycerin patches.  She will use a quarter patch daily.  Follow-up with me again in 4 weeks for reevaluation.  We will repeat her ultrasound at that time.  I also provided her with a prescription for [name] should she elect to start formal physical therapy before her follow-up visit.  This note was dictated using Dragon naturally speaking software and may contain errors in syntax, spelling, or content which have not been identified prior to signing this note.

## 2020-12-12 NOTE — Patient Instructions (Signed)
Nitroglycerin Protocol  Apply 1/4 nitroglycerin patch to affected area daily. Change position of patch within the affected area every 24 hours. You may experience a headache during the first 1-2 weeks of using the patch, these should subside. If you experience headaches after beginning nitroglycerin patch treatment, you may take your preferred over the counter pain reliever. Another side effect of the nitroglycerin patch is skin irritation or rash related to patch adhesive. Please notify our office if you develop more severe headaches or rash, and stop the patch. Tendon healing with nitroglycerin patch may require 12 to 24 weeks depending on the extent of injury. Men should not use if taking Viagra, Cialis, or Levitra.  Do not use if you have migraines or rosacea.     Follow up in 4 weeks

## 2021-01-02 DIAGNOSIS — M25521 Pain in right elbow: Secondary | ICD-10-CM | POA: Diagnosis not present

## 2021-01-03 DIAGNOSIS — U071 COVID-19: Secondary | ICD-10-CM | POA: Diagnosis not present

## 2021-01-15 DIAGNOSIS — M25521 Pain in right elbow: Secondary | ICD-10-CM | POA: Diagnosis not present

## 2021-01-16 ENCOUNTER — Ambulatory Visit: Payer: BC Managed Care – PPO | Admitting: Sports Medicine

## 2021-01-16 DIAGNOSIS — R87619 Unspecified abnormal cytological findings in specimens from cervix uteri: Secondary | ICD-10-CM | POA: Diagnosis not present

## 2021-01-16 DIAGNOSIS — N888 Other specified noninflammatory disorders of cervix uteri: Secondary | ICD-10-CM | POA: Diagnosis not present

## 2021-01-28 ENCOUNTER — Ambulatory Visit (INDEPENDENT_AMBULATORY_CARE_PROVIDER_SITE_OTHER): Payer: BC Managed Care – PPO | Admitting: Sports Medicine

## 2021-01-28 VITALS — BP 122/84 | Ht 68.0 in | Wt 165.0 lb

## 2021-01-28 DIAGNOSIS — M7541 Impingement syndrome of right shoulder: Secondary | ICD-10-CM | POA: Diagnosis not present

## 2021-01-28 DIAGNOSIS — M7711 Lateral epicondylitis, right elbow: Secondary | ICD-10-CM | POA: Diagnosis not present

## 2021-01-28 NOTE — Progress Notes (Signed)
   Subjective:    Patient ID: Toni Thompson, female    DOB: [birth year], 60 y.o.   MRN: [id number]  HPI  Toni Thompson presents today for follow-up on right elbow pain secondary to lateral epicondylitis.  Her pain is about the same.  Unfortunately, shortly after her last office visit with me, she was infected with COVID.  She still has some lingering congestion and sinus pressure as a result of that.  She did get some limited physical therapy but [name] felt like she had some shoulder impingement so that was addressed more than her right elbow.  Due to her illness she was somewhat inconsistent with her nitroglycerin patches.  She did return recently for another therapy visit even though her elbow was starting to feel better.  Therapist and training treated her which actually made her elbow hurt more. In regards to her right shoulder, she does get some occasional discomfort with swimming but it is tolerable.  She was given a handful of home exercises to do for this.  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    Review of Systems As above    Objective:   Physical Exam  Well-developed, well-nourished.  No acute distress  Right shoulder: Full range of motion other than some slight limitation with internal rotation of the right compared to the left.  No atrophy.  Positive empty can on the right.  Rotator cuff strength is 5/5.  Right elbow: Mild tenderness to palpation of the lateral epicondyle but minimal pain with resisted ECRB testing.  Good pulses  Limited MSK of the lateral epicondyle of the right elbow shows resolution of the prior hypoechoic change in the deep portion of the common extensor tendon.  It is now hyperechoic suggesting interval healing with scar tissue.      Assessment & Plan:   Right shoulder rotator cuff impingement Right elbow lateral epicondylitis  Home exercises for both the right shoulder impingement and lateral epicondylitis.  I do not  think she needs to return to formal physical therapy.  I also think she can discontinue her nitroglycerin patches altogether since her repeat ultrasound today shows evidence of healing.  I would still like for her to refrain from tennis until follow-up with me again in 4 weeks.  Of note, we will plan on a 30-minute follow-up appointment in case I need to perform an ultrasound of her right shoulder.  This note was dictated using Dragon naturally speaking software and may contain errors in syntax, spelling, or content which have not been identified prior to signing this note.

## 2021-03-06 ENCOUNTER — Ambulatory Visit (INDEPENDENT_AMBULATORY_CARE_PROVIDER_SITE_OTHER): Payer: BC Managed Care – PPO | Admitting: Sports Medicine

## 2021-03-06 VITALS — BP 120/78 | Ht 68.0 in | Wt 168.0 lb

## 2021-03-06 DIAGNOSIS — M5010 Cervical disc disorder with radiculopathy, unspecified cervical region: Secondary | ICD-10-CM | POA: Diagnosis not present

## 2021-03-06 NOTE — Progress Notes (Signed)
° °  Subjective:    Patient ID: Toni Thompson, female    DOB: 16-May-1960, 61 y.o.   MRN: [id number]  HPI  Toni Thompson presents today for follow-up on right shoulder and right elbow pain.  Right shoulder and elbow pain have improved.  Today she is complaining of new onset left shoulder pain with radiation into the left arm.  Pain began acutely a little over a week ago.  She did add a couple of new exercises to her workout routine.  She really began to experience discomfort during an exercise in Pilates class.  She is now complaining of persistent achy pain that begins in the posterior left shoulder and will radiate down into the left forearm.  She is having pain at night as well.  She has been trying to do her impingement exercises which were prescribed for her right shoulder but they have not been helpful.  She has been taking 600 mg of Advil as needed which has been minimally helpful.    Review of Systems As above    Objective:   Physical Exam  Well-developed, well-nourished.  No acute distress  Cervical spine: Full cervical range of motion.  No spasm.  Equivocal Spurling's to the left.  Neurological exam: She does have a mild amount of  weakness with resisted biceps flexion on the left compared to the right but this is also limited somewhat secondary to pain.  Remainder of her strength is 5/5.  Reflexes are brisk and equal at the biceps, triceps, and brachial radialis tendons.  Good pulses distally.      Assessment & Plan:   New onset left shoulder pain likely secondary to cervical radiculopathy  I discussed treatment options including physical therapy and oral anti-inflammatories as well as oral steroids.  Toni Thompson would like to avoid medications if at all possible.  Therefore, I recommended that she return to [facility] for some physical therapy.  She may also try acupuncture or dry needling.  If symptoms persist or worsen then she may want to re-consider merits of a steroid Dosepak.   Follow-up for ongoing or recalcitrant issues.  This note was dictated using Dragon naturally speaking software and may contain errors in syntax, spelling, or content which have not been identified prior to signing this note.

## 2021-03-11 DIAGNOSIS — M542 Cervicalgia: Secondary | ICD-10-CM | POA: Diagnosis not present

## 2021-03-11 DIAGNOSIS — M5413 Radiculopathy, cervicothoracic region: Secondary | ICD-10-CM | POA: Diagnosis not present

## 2021-03-13 DIAGNOSIS — M542 Cervicalgia: Secondary | ICD-10-CM | POA: Diagnosis not present

## 2021-03-13 DIAGNOSIS — M5413 Radiculopathy, cervicothoracic region: Secondary | ICD-10-CM | POA: Diagnosis not present

## 2021-03-18 DIAGNOSIS — M5413 Radiculopathy, cervicothoracic region: Secondary | ICD-10-CM | POA: Diagnosis not present

## 2021-03-18 DIAGNOSIS — M542 Cervicalgia: Secondary | ICD-10-CM | POA: Diagnosis not present

## 2021-03-20 DIAGNOSIS — M5413 Radiculopathy, cervicothoracic region: Secondary | ICD-10-CM | POA: Diagnosis not present

## 2021-03-20 DIAGNOSIS — M542 Cervicalgia: Secondary | ICD-10-CM | POA: Diagnosis not present

## 2021-03-24 DIAGNOSIS — M542 Cervicalgia: Secondary | ICD-10-CM | POA: Diagnosis not present

## 2021-03-24 DIAGNOSIS — M5413 Radiculopathy, cervicothoracic region: Secondary | ICD-10-CM | POA: Diagnosis not present

## 2021-03-27 ENCOUNTER — Ambulatory Visit (INDEPENDENT_AMBULATORY_CARE_PROVIDER_SITE_OTHER): Payer: BC Managed Care – PPO | Admitting: Sports Medicine

## 2021-03-27 ENCOUNTER — Ambulatory Visit: Payer: Self-pay

## 2021-03-27 VITALS — BP 110/72 | Ht 68.0 in | Wt 165.0 lb

## 2021-03-27 DIAGNOSIS — M79602 Pain in left arm: Secondary | ICD-10-CM

## 2021-03-27 NOTE — Progress Notes (Addendum)
° °  Subjective:    Patient ID: Toni Thompson, female    DOB: April 30, 1960, 61 y.o.   MRN: [id number]  HPI  Toni Thompson presents today with persistent left shoulder pain.  Physical therapy has not been helpful.  Pain continues to be diffuse throughout the shoulder with radiating pain into the biceps.  It does seem to be related to certain shoulder motions such as abduction and internal rotation.  She is having trouble swimming due to her pain.  She is also having some returning right shoulder pain secondary to rotator cuff impingement.  She is frustrated with her pain.    Review of Systems As above    Objective:   Physical Exam  Well-developed, well-nourished.  Left shoulder: Full range of motion.  Positive painful arc.  She is tender to palpation at the [facility] joint.  Positive empty can, positive Hawkins.  Rotator cuff strength is 5/5 but she does have pain with resisted supraspinatus.  Good strength with resisted internal and external rotation.  Neurovascularly intact distally.  MSK ultrasound left shoulder:  Scan of the proximal biceps tendon does show a small amount of fluid around the tendon.  AC joint has a positive geyser sign.  Supraspinatus does show some slight hypoechoic changes within the substance of the tendon.  Subscapularis and infraspinatus appear unremarkable.  Findings are suggestive of possible partial-thickness rotator cuff tear.  A quick limited ultrasound of the right shoulder looking at the supraspinatus shows no obvious abnormality here.      Assessment & Plan:   Persistent left shoulder pain secondary to rotator cuff impingement versus partial rotator cuff tear versus labral tear  At this point in time I think it would be best for me to refer her to one of the orthopedic shoulder specialists. I recommend Dr. Ave Filter.  I will defer further work-up and treatment to his discretion.  In the meantime, she will remain out of the pool and avoid tennis until that appointment.   Follow-up with me as needed.  This note was dictated using Dragon naturally speaking software and may contain errors in syntax, spelling, or content which have not been identified prior to signing this note.

## 2021-03-27 NOTE — Patient Instructions (Addendum)
[facility] [address] [place], [place] [phone]  Appt: Friday 03/28/21 @ 8:45 am. Please arrive at 8:15 am.

## 2021-03-28 DIAGNOSIS — M25512 Pain in left shoulder: Secondary | ICD-10-CM | POA: Diagnosis not present

## 2021-03-28 DIAGNOSIS — M7712 Lateral epicondylitis, left elbow: Secondary | ICD-10-CM | POA: Diagnosis not present

## 2021-04-08 DIAGNOSIS — M5413 Radiculopathy, cervicothoracic region: Secondary | ICD-10-CM | POA: Diagnosis not present

## 2021-04-08 DIAGNOSIS — M542 Cervicalgia: Secondary | ICD-10-CM | POA: Diagnosis not present

## 2021-04-10 DIAGNOSIS — M542 Cervicalgia: Secondary | ICD-10-CM | POA: Diagnosis not present

## 2021-04-10 DIAGNOSIS — M5413 Radiculopathy, cervicothoracic region: Secondary | ICD-10-CM | POA: Diagnosis not present

## 2021-04-15 ENCOUNTER — Ambulatory Visit
Admission: RE | Admit: 2021-04-15 | Discharge: 2021-04-15 | Disposition: A | Discharge status: Home / Self Care | Payer: BC Managed Care – PPO | Source: Ambulatory Visit | Attending: Family Medicine | Admitting: Family Medicine

## 2021-04-15 DIAGNOSIS — Z780 Asymptomatic menopausal state: Secondary | ICD-10-CM | POA: Diagnosis not present

## 2021-04-15 DIAGNOSIS — E2839 Other primary ovarian failure: Secondary | ICD-10-CM

## 2021-04-16 DIAGNOSIS — M5413 Radiculopathy, cervicothoracic region: Secondary | ICD-10-CM | POA: Diagnosis not present

## 2021-04-16 DIAGNOSIS — M542 Cervicalgia: Secondary | ICD-10-CM | POA: Diagnosis not present

## 2021-04-24 DIAGNOSIS — M5413 Radiculopathy, cervicothoracic region: Secondary | ICD-10-CM | POA: Diagnosis not present

## 2021-04-24 DIAGNOSIS — M542 Cervicalgia: Secondary | ICD-10-CM | POA: Diagnosis not present

## 2021-04-29 DIAGNOSIS — M5413 Radiculopathy, cervicothoracic region: Secondary | ICD-10-CM | POA: Diagnosis not present

## 2021-04-29 DIAGNOSIS — M542 Cervicalgia: Secondary | ICD-10-CM | POA: Diagnosis not present

## 2021-05-05 DIAGNOSIS — L814 Other melanin hyperpigmentation: Secondary | ICD-10-CM | POA: Diagnosis not present

## 2021-05-05 DIAGNOSIS — L578 Other skin changes due to chronic exposure to nonionizing radiation: Secondary | ICD-10-CM | POA: Diagnosis not present

## 2021-05-05 DIAGNOSIS — L821 Other seborrheic keratosis: Secondary | ICD-10-CM | POA: Diagnosis not present

## 2021-05-05 DIAGNOSIS — M5413 Radiculopathy, cervicothoracic region: Secondary | ICD-10-CM | POA: Diagnosis not present

## 2021-05-05 DIAGNOSIS — M542 Cervicalgia: Secondary | ICD-10-CM | POA: Diagnosis not present

## 2021-05-05 DIAGNOSIS — Z85828 Personal history of other malignant neoplasm of skin: Secondary | ICD-10-CM | POA: Diagnosis not present

## 2021-05-08 DIAGNOSIS — M5413 Radiculopathy, cervicothoracic region: Secondary | ICD-10-CM | POA: Diagnosis not present

## 2021-05-08 DIAGNOSIS — M542 Cervicalgia: Secondary | ICD-10-CM | POA: Diagnosis not present

## 2021-05-12 DIAGNOSIS — M5413 Radiculopathy, cervicothoracic region: Secondary | ICD-10-CM | POA: Diagnosis not present

## 2021-05-12 DIAGNOSIS — M542 Cervicalgia: Secondary | ICD-10-CM | POA: Diagnosis not present

## 2021-05-15 DIAGNOSIS — M5413 Radiculopathy, cervicothoracic region: Secondary | ICD-10-CM | POA: Diagnosis not present

## 2021-05-15 DIAGNOSIS — M542 Cervicalgia: Secondary | ICD-10-CM | POA: Diagnosis not present

## 2021-05-19 DIAGNOSIS — M5413 Radiculopathy, cervicothoracic region: Secondary | ICD-10-CM | POA: Diagnosis not present

## 2021-05-19 DIAGNOSIS — M542 Cervicalgia: Secondary | ICD-10-CM | POA: Diagnosis not present

## 2021-05-26 DIAGNOSIS — M542 Cervicalgia: Secondary | ICD-10-CM | POA: Diagnosis not present

## 2021-05-26 DIAGNOSIS — M5413 Radiculopathy, cervicothoracic region: Secondary | ICD-10-CM | POA: Diagnosis not present

## 2021-06-05 DIAGNOSIS — M542 Cervicalgia: Secondary | ICD-10-CM | POA: Diagnosis not present

## 2021-06-05 DIAGNOSIS — M5413 Radiculopathy, cervicothoracic region: Secondary | ICD-10-CM | POA: Diagnosis not present

## 2021-06-11 ENCOUNTER — Ambulatory Visit (INDEPENDENT_AMBULATORY_CARE_PROVIDER_SITE_OTHER): Payer: BC Managed Care – PPO | Admitting: Orthopedic Surgery

## 2021-06-11 ENCOUNTER — Encounter: Payer: Self-pay | Admitting: Orthopedic Surgery

## 2021-06-11 DIAGNOSIS — M25512 Pain in left shoulder: Secondary | ICD-10-CM

## 2021-06-11 DIAGNOSIS — M25561 Pain in right knee: Secondary | ICD-10-CM

## 2021-06-11 DIAGNOSIS — G8929 Other chronic pain: Secondary | ICD-10-CM

## 2021-06-11 DIAGNOSIS — M25562 Pain in left knee: Secondary | ICD-10-CM | POA: Diagnosis not present

## 2021-06-11 NOTE — Progress Notes (Signed)
? ?Office Visit Note ?  ?Patient: Toni Thompson           ?Date of Birth: Apr 23, 1960           ?MRN: [id number] ?Visit Date: 06/11/2021 ?Requested by: Gweneth Dimitri, MD ?[address] ?[place],  [place] [address] ?PCP: Gweneth Dimitri, MD ? ?Subjective: ?Chief Complaint  ?Patient presents with  ? Left Shoulder - Pain  ? ? ?HPI: Toni Thompson is a 61 year old active patient with left shoulder pain.  Reports pain since January.  Started after she did a lot of intense exercises.  She does stationary bike along with Pilates and swimming more or less on a daily basis 1 or the other.  She is right-hand dominant.  Pain does not wake her from sleep at night.  Initially had left arm and forearm and biceps pain but that got better with dry needling.  She has not had any injections.  Reports occasional neck pain with occasional numbness and tingling in the hand.  She does a lot of swimming but has not been able to do that since January.  She has seen multiple physicians and the last physician she saw was Dr. Prince Rome who examined her with ultrasound and identified biceps subluxation but not dislocation out of the bicipital groove.  That is confirmed on MRI scan reviewed today.  MRI scan was not definitive about upper border subscapularis but on my review it looks like it may have some degenerative tearing which is not severe.  Remainder of the rotator cuff looks intact.  Labrum also appears intact. ?             ?ROS: All systems reviewed are negative as they relate to the chief complaint within the history of present illness.  Patient denies  fevers or chills. ? ? ?Assessment & Plan: ?Visit Diagnoses:  ?1. Chronic left shoulder pain   ?2. Bilateral chronic knee pain   ? ? ?Plan: Impression is left arm pain with multiple etiologies..  I think in general she may be slightly outside of her envelope of comfortable function with the amount of activity she was doing.  I think she does have biceps tendon pathology which is mild at this  time.  Should that become more severe then operative intervention would be indicated to perform biceps tenodesis and upper border subscap repair.  For now I think that she can resume her activities but at a lower intensity level to see if she is able to manage her symptoms.  She did have some forearm pain as well which could be related to tennis elbow or radiculopathy but that all improved with dry needling and currently is not an issue.  Her main issue now is some pain with abduction.  Again rotator cuff strength is excellent.  I think she is heading for intervention at sometime in the future but for now it is little bit longer with activity modification.  Plan to see her back in about 4 months for clinical recheck or sooner should she develop worse symptoms. ? ?Follow-Up Instructions: Return for after MRI.  ? ?Orders:  ?No orders of the defined types were placed in this encounter. ? ?No orders of the defined types were placed in this encounter. ? ? ? ? Procedures: ?No procedures performed ? ? ?Clinical Data: ?No additional findings. ? ?Objective: ?Vital Signs: There were no vitals taken for this visit. ? ?Physical Exam:  ? ?Constitutional: Patient appears well-developed ?HEENT:  ?Head: Normocephalic ?Eyes:EOM are normal ?Neck:  Normal range of motion ?Cardiovascular: Normal rate ?Pulmonary/chest: Effort normal ?Neurologic: Patient is alert ?Skin: Skin is warm ?Psychiatric: Patient has normal mood and affect ? ? ?Ortho Exam: Ortho exam demonstrates full cervical spine range of motion.  5 out of 5 grip EPL and interosseous resection extension bicep tricep and deltoid strength.  Negative O'Brien's testing bilaterally.  No impingement signs.  No discrete AC joint tenderness to direct palpation.  No other masses lymphadenopathy or skin changes noted in the shoulder girdle region.  Passive range of motion bilaterally is 75/110/180. ? ?Specialty Comments:  ?No specialty comments available. ? ?Imaging: ?No results  found. ? ? ?PMFS History: ?Patient Active Problem List  ? Diagnosis Date Noted  ? Left shoulder pain 08/15/2019  ? TMJ (temporomandibular joint disorder) 10/29/2016  ? Calcific supraspinatus tendinitis 07/23/2016  ? Neck pain 06/04/2016  ? Cervical radiculopathy 06/04/2016  ? Lateral epicondylitis of left elbow 05/21/2015  ? Abnormality of gait 01/10/2015  ? Pes cavus 01/10/2015  ? Hallux valgus 01/10/2015  ? Low back pain 11/07/2013  ? Hallux rigidus of right foot 11/07/2013  ? Contact dermatitis 07/27/2012  ? Right elbow pain 05/17/2012  ? Left knee pain 05/17/2012  ? Right lateral epicondylitis 05/17/2012  ? 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 BUNIONS, BILATERAL 03/15/2008  ? CAVUS DEFORMITY OF FOOT, ACQUIRED 03/15/2008  ? PALPITATIONS 03/15/2008  ? ?Past Medical History:  ?Diagnosis Date  ? Contact lens/glasses fitting   ? wears contacts or glasses  ?  ?History reviewed. No pertinent family history.  ?Past Surgical History:  ?Procedure Laterality Date  ? CHEILECTOMY Right 01/12/2013  ? Procedure: RIGHT HALLUX RIGIDUS WITH CHEILECTOMY 1ST METATARSOPHALANGEAL;  Surgeon: Loreta Ave, MD;  Location: [facility];  Service: Orthopedics;  Laterality: Right;  ? COLONOSCOPY    ? CRANIOTOMY FOR TUMOR    ? benign age 26  ? DILATION AND CURETTAGE OF UTERUS    ? ?Social History  ? ?Occupational History  ? Not on file  ?Tobacco Use  ? Smoking status: Former  ?  Types: Cigarettes  ?  Quit date: 03/02/2001  ?  Years since quitting: 20.2  ? Smokeless tobacco: Never  ?Substance and Sexual Activity  ? Alcohol use: Yes  ?  Comment: occ  ? Drug use: No  ? Sexual activity: Not on file  ? ? ? ? ? ?

## 2021-06-16 NOTE — Progress Notes (Signed)
Scheduled

## 2021-07-13 ENCOUNTER — Encounter: Payer: Self-pay | Admitting: Orthopedic Surgery

## 2021-07-24 DIAGNOSIS — R87611 Atypical squamous cells cannot exclude high grade squamous intraepithelial lesion on cytologic smear of cervix (ASC-H): Secondary | ICD-10-CM | POA: Diagnosis not present

## 2021-08-06 DIAGNOSIS — M542 Cervicalgia: Secondary | ICD-10-CM | POA: Diagnosis not present

## 2021-08-06 DIAGNOSIS — M5413 Radiculopathy, cervicothoracic region: Secondary | ICD-10-CM | POA: Diagnosis not present

## 2021-10-01 DIAGNOSIS — H5212 Myopia, left eye: Secondary | ICD-10-CM | POA: Diagnosis not present

## 2021-10-01 DIAGNOSIS — H25013 Cortical age-related cataract, bilateral: Secondary | ICD-10-CM | POA: Diagnosis not present

## 2021-10-01 DIAGNOSIS — H2513 Age-related nuclear cataract, bilateral: Secondary | ICD-10-CM | POA: Diagnosis not present

## 2021-10-06 ENCOUNTER — Other Ambulatory Visit: Payer: Self-pay | Admitting: Family Medicine

## 2021-10-06 DIAGNOSIS — Z1231 Encounter for screening mammogram for malignant neoplasm of breast: Secondary | ICD-10-CM

## 2021-10-16 ENCOUNTER — Ambulatory Visit: Payer: BC Managed Care – PPO | Admitting: Orthopedic Surgery

## 2021-11-06 ENCOUNTER — Ambulatory Visit
Admission: RE | Admit: 2021-11-06 | Discharge: 2021-11-06 | Disposition: A | Discharge status: Home / Self Care | Payer: BC Managed Care – PPO | Source: Ambulatory Visit | Attending: Family Medicine | Admitting: Family Medicine

## 2021-11-06 DIAGNOSIS — Z1231 Encounter for screening mammogram for malignant neoplasm of breast: Secondary | ICD-10-CM

## 2021-12-29 DIAGNOSIS — Z6827 Body mass index (BMI) 27.0-27.9, adult: Secondary | ICD-10-CM | POA: Diagnosis not present

## 2021-12-29 DIAGNOSIS — Z01419 Encounter for gynecological examination (general) (routine) without abnormal findings: Secondary | ICD-10-CM | POA: Diagnosis not present

## 2021-12-29 DIAGNOSIS — Z124 Encounter for screening for malignant neoplasm of cervix: Secondary | ICD-10-CM | POA: Diagnosis not present

## 2021-12-29 DIAGNOSIS — Z1151 Encounter for screening for human papillomavirus (HPV): Secondary | ICD-10-CM | POA: Diagnosis not present

## 2022-05-18 DIAGNOSIS — L821 Other seborrheic keratosis: Secondary | ICD-10-CM | POA: Diagnosis not present

## 2022-05-18 DIAGNOSIS — L578 Other skin changes due to chronic exposure to nonionizing radiation: Secondary | ICD-10-CM | POA: Diagnosis not present

## 2022-05-18 DIAGNOSIS — Z85828 Personal history of other malignant neoplasm of skin: Secondary | ICD-10-CM | POA: Diagnosis not present

## 2022-05-18 DIAGNOSIS — L814 Other melanin hyperpigmentation: Secondary | ICD-10-CM | POA: Diagnosis not present

## 2022-10-05 DIAGNOSIS — H2513 Age-related nuclear cataract, bilateral: Secondary | ICD-10-CM | POA: Diagnosis not present

## 2022-10-05 DIAGNOSIS — H25013 Cortical age-related cataract, bilateral: Secondary | ICD-10-CM | POA: Diagnosis not present

## 2022-10-05 DIAGNOSIS — H5212 Myopia, left eye: Secondary | ICD-10-CM | POA: Diagnosis not present

## 2022-10-05 DIAGNOSIS — H04123 Dry eye syndrome of bilateral lacrimal glands: Secondary | ICD-10-CM | POA: Diagnosis not present

## 2022-10-09 ENCOUNTER — Other Ambulatory Visit: Payer: Self-pay | Admitting: Family Medicine

## 2022-10-09 DIAGNOSIS — Z87891 Personal history of nicotine dependence: Secondary | ICD-10-CM

## 2023-01-07 ENCOUNTER — Other Ambulatory Visit: Payer: Self-pay | Admitting: Obstetrics and Gynecology

## 2023-01-07 ENCOUNTER — Other Ambulatory Visit: Payer: Self-pay | Admitting: Family Medicine

## 2023-01-07 DIAGNOSIS — Z1231 Encounter for screening mammogram for malignant neoplasm of breast: Secondary | ICD-10-CM

## 2023-01-22 DIAGNOSIS — Z01419 Encounter for gynecological examination (general) (routine) without abnormal findings: Secondary | ICD-10-CM | POA: Diagnosis not present

## 2023-01-22 DIAGNOSIS — Z124 Encounter for screening for malignant neoplasm of cervix: Secondary | ICD-10-CM | POA: Diagnosis not present

## 2023-01-22 DIAGNOSIS — Z6829 Body mass index (BMI) 29.0-29.9, adult: Secondary | ICD-10-CM | POA: Diagnosis not present

## 2023-02-04 ENCOUNTER — Ambulatory Visit
Admission: RE | Admit: 2023-02-04 | Discharge: 2023-02-04 | Disposition: A | Discharge status: Home / Self Care | Payer: BC Managed Care – PPO | Source: Ambulatory Visit | Attending: Obstetrics and Gynecology | Admitting: Obstetrics and Gynecology

## 2023-02-04 DIAGNOSIS — Z1231 Encounter for screening mammogram for malignant neoplasm of breast: Secondary | ICD-10-CM | POA: Diagnosis not present

## 2023-02-05 ENCOUNTER — Ambulatory Visit: Payer: BC Managed Care – PPO

## 2023-03-25 DIAGNOSIS — H2513 Age-related nuclear cataract, bilateral: Secondary | ICD-10-CM | POA: Diagnosis not present

## 2023-03-25 DIAGNOSIS — H04123 Dry eye syndrome of bilateral lacrimal glands: Secondary | ICD-10-CM | POA: Diagnosis not present

## 2023-05-03 DIAGNOSIS — M79672 Pain in left foot: Secondary | ICD-10-CM | POA: Diagnosis not present

## 2023-05-03 DIAGNOSIS — M13861 Other specified arthritis, right knee: Secondary | ICD-10-CM | POA: Diagnosis not present

## 2023-05-03 DIAGNOSIS — M25561 Pain in right knee: Secondary | ICD-10-CM | POA: Diagnosis not present

## 2023-05-03 DIAGNOSIS — M13872 Other specified arthritis, left ankle and foot: Secondary | ICD-10-CM | POA: Diagnosis not present

## 2023-05-17 DIAGNOSIS — M13872 Other specified arthritis, left ankle and foot: Secondary | ICD-10-CM | POA: Diagnosis not present

## 2023-05-17 DIAGNOSIS — M13861 Other specified arthritis, right knee: Secondary | ICD-10-CM | POA: Diagnosis not present

## 2023-05-17 DIAGNOSIS — M79672 Pain in left foot: Secondary | ICD-10-CM | POA: Diagnosis not present

## 2023-05-17 DIAGNOSIS — M25561 Pain in right knee: Secondary | ICD-10-CM | POA: Diagnosis not present

## 2023-05-31 DIAGNOSIS — M13872 Other specified arthritis, left ankle and foot: Secondary | ICD-10-CM | POA: Diagnosis not present

## 2023-05-31 DIAGNOSIS — M79672 Pain in left foot: Secondary | ICD-10-CM | POA: Diagnosis not present

## 2023-05-31 DIAGNOSIS — M13861 Other specified arthritis, right knee: Secondary | ICD-10-CM | POA: Diagnosis not present

## 2023-05-31 DIAGNOSIS — M25561 Pain in right knee: Secondary | ICD-10-CM | POA: Diagnosis not present

## 2023-06-02 DIAGNOSIS — L821 Other seborrheic keratosis: Secondary | ICD-10-CM | POA: Diagnosis not present

## 2023-06-02 DIAGNOSIS — Z85828 Personal history of other malignant neoplasm of skin: Secondary | ICD-10-CM | POA: Diagnosis not present

## 2023-06-02 DIAGNOSIS — L578 Other skin changes due to chronic exposure to nonionizing radiation: Secondary | ICD-10-CM | POA: Diagnosis not present

## 2023-06-02 DIAGNOSIS — L814 Other melanin hyperpigmentation: Secondary | ICD-10-CM | POA: Diagnosis not present

## 2023-06-14 DIAGNOSIS — M79672 Pain in left foot: Secondary | ICD-10-CM | POA: Diagnosis not present

## 2023-06-14 DIAGNOSIS — M13872 Other specified arthritis, left ankle and foot: Secondary | ICD-10-CM | POA: Diagnosis not present

## 2023-06-14 DIAGNOSIS — M25561 Pain in right knee: Secondary | ICD-10-CM | POA: Diagnosis not present

## 2023-06-14 DIAGNOSIS — M13861 Other specified arthritis, right knee: Secondary | ICD-10-CM | POA: Diagnosis not present

## 2023-06-21 DIAGNOSIS — M25561 Pain in right knee: Secondary | ICD-10-CM | POA: Diagnosis not present

## 2023-06-21 DIAGNOSIS — M79672 Pain in left foot: Secondary | ICD-10-CM | POA: Diagnosis not present

## 2023-06-21 DIAGNOSIS — M13872 Other specified arthritis, left ankle and foot: Secondary | ICD-10-CM | POA: Diagnosis not present

## 2023-06-21 DIAGNOSIS — M13861 Other specified arthritis, right knee: Secondary | ICD-10-CM | POA: Diagnosis not present

## 2023-07-14 DIAGNOSIS — M13872 Other specified arthritis, left ankle and foot: Secondary | ICD-10-CM | POA: Diagnosis not present

## 2023-07-14 DIAGNOSIS — M79672 Pain in left foot: Secondary | ICD-10-CM | POA: Diagnosis not present

## 2023-07-14 DIAGNOSIS — M25561 Pain in right knee: Secondary | ICD-10-CM | POA: Diagnosis not present

## 2023-07-14 DIAGNOSIS — M13861 Other specified arthritis, right knee: Secondary | ICD-10-CM | POA: Diagnosis not present

## 2023-07-28 DIAGNOSIS — M13861 Other specified arthritis, right knee: Secondary | ICD-10-CM | POA: Diagnosis not present

## 2023-07-28 DIAGNOSIS — M13872 Other specified arthritis, left ankle and foot: Secondary | ICD-10-CM | POA: Diagnosis not present

## 2023-07-28 DIAGNOSIS — M25561 Pain in right knee: Secondary | ICD-10-CM | POA: Diagnosis not present

## 2023-07-28 DIAGNOSIS — M79672 Pain in left foot: Secondary | ICD-10-CM | POA: Diagnosis not present

## 2023-08-02 DIAGNOSIS — L820 Inflamed seborrheic keratosis: Secondary | ICD-10-CM | POA: Diagnosis not present

## 2023-10-21 DIAGNOSIS — H2513 Age-related nuclear cataract, bilateral: Secondary | ICD-10-CM | POA: Diagnosis not present

## 2023-10-21 DIAGNOSIS — H52201 Unspecified astigmatism, right eye: Secondary | ICD-10-CM | POA: Diagnosis not present

## 2023-10-21 DIAGNOSIS — H04123 Dry eye syndrome of bilateral lacrimal glands: Secondary | ICD-10-CM | POA: Diagnosis not present

## 2023-10-21 DIAGNOSIS — H25013 Cortical age-related cataract, bilateral: Secondary | ICD-10-CM | POA: Diagnosis not present

## 2023-10-21 DIAGNOSIS — H5212 Myopia, left eye: Secondary | ICD-10-CM | POA: Diagnosis not present

## 2023-11-18 ENCOUNTER — Other Ambulatory Visit: Payer: Self-pay | Admitting: Family Medicine

## 2023-11-18 DIAGNOSIS — Z87891 Personal history of nicotine dependence: Secondary | ICD-10-CM

## 2023-12-22 ENCOUNTER — Other Ambulatory Visit: Payer: Self-pay | Admitting: Family Medicine

## 2023-12-22 DIAGNOSIS — Z1231 Encounter for screening mammogram for malignant neoplasm of breast: Secondary | ICD-10-CM

## 2024-01-19 DIAGNOSIS — R42 Dizziness and giddiness: Secondary | ICD-10-CM | POA: Diagnosis not present

## 2024-02-07 ENCOUNTER — Encounter: Payer: Self-pay | Admitting: Neurology

## 2024-02-09 ENCOUNTER — Ambulatory Visit

## 2024-02-15 ENCOUNTER — Encounter: Payer: Self-pay | Admitting: Family Medicine

## 2024-02-29 ENCOUNTER — Ambulatory Visit
Admission: RE | Admit: 2024-02-29 | Discharge: 2024-02-29 | Disposition: A | Discharge status: Home / Self Care | Source: Ambulatory Visit | Attending: Family Medicine | Admitting: Family Medicine

## 2024-02-29 DIAGNOSIS — Z1231 Encounter for screening mammogram for malignant neoplasm of breast: Secondary | ICD-10-CM

## 2024-03-08 ENCOUNTER — Ambulatory Visit
Admission: RE | Admit: 2024-03-08 | Discharge: 2024-03-08 | Disposition: A | Discharge status: Home / Self Care | Source: Ambulatory Visit | Attending: Family Medicine | Admitting: Family Medicine

## 2024-03-08 DIAGNOSIS — Z87891 Personal history of nicotine dependence: Secondary | ICD-10-CM

## 2024-03-20 ENCOUNTER — Other Ambulatory Visit: Payer: Self-pay | Admitting: Sports Medicine

## 2024-03-20 ENCOUNTER — Encounter: Payer: Self-pay | Admitting: Sports Medicine

## 2024-03-20 DIAGNOSIS — M25561 Pain in right knee: Secondary | ICD-10-CM

## 2024-03-21 ENCOUNTER — Other Ambulatory Visit: Payer: Self-pay | Admitting: Family Medicine

## 2024-03-21 DIAGNOSIS — E785 Hyperlipidemia, unspecified: Secondary | ICD-10-CM

## 2024-03-21 NOTE — Telephone Encounter (Signed)
 Yes.  Called and left a message for patient to return my call concerning her insurance for gel injection.

## 2024-03-31 ENCOUNTER — Other Ambulatory Visit (HOSPITAL_COMMUNITY): Payer: Self-pay

## 2024-03-31 ENCOUNTER — Ambulatory Visit: Attending: Cardiovascular Disease | Admitting: Cardiovascular Disease

## 2024-03-31 ENCOUNTER — Encounter: Payer: Self-pay | Admitting: Cardiovascular Disease

## 2024-03-31 VITALS — BP 123/77 | HR 61 | Ht 68.0 in | Wt 168.4 lb

## 2024-03-31 DIAGNOSIS — I2510 Atherosclerotic heart disease of native coronary artery without angina pectoris: Secondary | ICD-10-CM | POA: Diagnosis not present

## 2024-03-31 DIAGNOSIS — Z79899 Other long term (current) drug therapy: Secondary | ICD-10-CM

## 2024-03-31 DIAGNOSIS — R002 Palpitations: Secondary | ICD-10-CM

## 2024-03-31 DIAGNOSIS — E782 Mixed hyperlipidemia: Secondary | ICD-10-CM

## 2024-03-31 MED ORDER — ROSUVASTATIN CALCIUM 5 MG PO TABS
5.0000 mg | ORAL_TABLET | Freq: Every day | ORAL | 3 refills | Status: DC
  Filled 2024-03-31: qty 90, 90d supply, fill #0

## 2024-03-31 MED ORDER — ROSUVASTATIN CALCIUM 5 MG PO TABS
5.0000 mg | ORAL_TABLET | Freq: Every day | ORAL | 3 refills | Status: AC
  Filled 2024-03-31: qty 90, 90d supply, fill #0

## 2024-03-31 NOTE — Progress Notes (Signed)
 CARDIOLOGY CONSULT NOTE       Patient ID: Toni Thompson MRN: [id number] DOB/AGE: [birth year] 63 y.o.  Referring Physician: Mat Primary Physician: Hughie Sharper, MD Primary Cardiologist: New Reason for Consultation: CAD    HPI:  64 y.o. referred by Dr Mat for CAD. She has a 36 pack year history of smoking Lung cancer CT done 03/13/24 showed one vessel LAD coronary calcium  and no lung cancer She has no other CRF's She is not on any other meds for cholesterol, HTN, DM  Her LpA was 195, Apo B low and LDL 131 She exercises quite a bit including semi competitive swimming. She has a great diet vegetarian with fish. 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 She has no symptoms No chest pain, palpitations, dyspnea or syncope.   She is having a lot of joint issues and taking meloxicam  daily She does not think she can currently walk on treadmill   ROS All other systems reviewed and negative except as noted above  Past Medical History:  Diagnosis Date   Contact lens/glasses fitting    wears contacts or glasses    No family history on file.  Social History   Socioeconomic History   Marital status: Single    Spouse name: Not on file   Number of children: Not on file   Years of education: Not on file   Highest education level: Not on file  Occupational History   Not on file  Tobacco Use   Smoking status: Former    Current packs/day: 0.00    Types: Cigarettes    Quit date: 03/02/2001    Years since quitting: 23.0   Smokeless tobacco: Never  Substance and Sexual Activity   Alcohol use: Yes    Comment: occ   Drug use: No   Sexual activity: Not on file  Other Topics Concern   Not on file  Social History Narrative   Not on file   Social Drivers of Health   Tobacco Use: Medium Risk (03/31/2024)   Patient History    Smoking Tobacco Use: Former    Smokeless  Tobacco Use: Never    Passive Exposure: Not on Actuary Strain: Not on file  Food Insecurity: Not on file  Transportation Needs: Not on file  Physical Activity: Not on file  Stress: Not on file  Social Connections: Not on file  Intimate Partner Violence: Not on file  Depression (EYV7-0): Not on file  Alcohol Screen: Not on file  Housing: Not on file  Utilities: Not on file  Health Literacy: Not on file    Past Surgical History:  Procedure Laterality Date   CHEILECTOMY Right 01/12/2013   Procedure: RIGHT HALLUX RIGIDUS WITH CHEILECTOMY 1ST METATARSOPHALANGEAL;  Surgeon: Toribio JULIANNA Chancy, MD;  Location: [facility];  Service: Orthopedics;  Laterality: Right;   COLONOSCOPY     CRANIOTOMY FOR TUMOR     benign age 64   DILATION AND CURETTAGE OF UTERUS       Current Medications[1]    Physical Exam: Blood pressure 123/77, pulse 61, height 5' 8 (1.727 m), weight 168 lb 6.4 oz (76.4 kg), SpO2 96%.   Affect appropriate Healthy:  appears stated age HEENT: normal Neck supple with no adenopathy JVP normal no bruits no thyromegaly Lungs clear with no wheezing and good diaphragmatic motion Heart:  S1/S2 no murmur,  no rub, gallop or click PMI normal Abdomen: benighn, BS positve, no tenderness, no AAA no bruit.  No HSM or HJR Distal pulses intact with no bruits No edema Neuro non-focal Skin warm and dry No muscular weakness   Labs:   Lab Results  Component Value Date   HGB 14.3 01/12/2013   No results for input(s): NA, K, CL, CO2, BUN, CREATININE, CALCIUM , PROT, BILITOT, ALKPHOS, ALT, AST, GLUCOSE in the last 168 hours.  Invalid input(s): LABALBU No results found for: CKTOTAL, CKMB, CKMBINDEX, TROPONINI No results found for: CHOL No results found for: HDL No results found for: LDLCALC No results found for: TRIG No results found for: CHOLHDL No results found for: LDLDIRECT    Radiology: CT  CHEST LUNG CA SCREEN LOW DOSE W/O CM Result Date: 03/13/2024 CLINICAL DATA:  64 year old female former smoker with 36 pack-year smoking history EXAM: CT CHEST WITHOUT CONTRAST LOW-DOSE FOR LUNG CANCER SCREENING TECHNIQUE: Multidetector CT imaging of the chest was performed following the standard protocol without IV contrast. RADIATION DOSE REDUCTION: This exam was performed according to the departmental dose-optimization program which includes automated exposure control, adjustment of the mA and/or kV according to patient size and/or use of iterative reconstruction technique. COMPARISON:  None Available. FINDINGS: Cardiovascular: Normal heart size. No significant pericardial effusion/thickening. Left anterior descending coronary atherosclerosis. Atherosclerotic nonaneurysmal thoracic aorta. Normal caliber pulmonary arteries. Mediastinum/Nodes: No significant thyroid  nodules. Unremarkable esophagus. No pathologically enlarged axillary, mediastinal or hilar lymph nodes, noting limited sensitivity for the detection of hilar adenopathy on this noncontrast study. Lungs/Pleura: No pneumothorax. No pleural effusion. Mild centrilobular emphysema with diffuse bronchial wall thickening. No acute consolidative airspace disease or lung masses. Tiny 2.9 mm solid posterior basilar left lower lobe pulmonary nodule on image 246. No additional significant pulmonary nodules. Upper abdomen: No acute abnormality. Musculoskeletal: No aggressive appearing focal osseous lesions. Mild thoracic spondylosis. IMPRESSION: 1. Lung-RADS 2, benign appearance or behavior. Continue annual screening with low-dose chest CT without contrast in 12 months. 2. One vessel coronary atherosclerosis. 3. Aortic Atherosclerosis (ICD10-I70.0) and Emphysema (ICD10-J43.9). Electronically Signed   By: Selinda DELENA Blue M.D.   On: 03/13/2024 18:18    EKG: SR rate 68 normal    ASSESSMENT AND PLAN:   CAD subclinical seen on lung cancer CT. She is no longer  smoking. Not unexpected to see some calcium  on CT in this age group with smoking Her's is actually only in LAD and usually in smoker it is 3 vessel. She is active with no symptoms Does not need CTA/stress testing. In future if knees improve would do ETT and no fancy testing She will f/u wit formal calcium  score to get number and percentile for reference  HLD:  Discussed genetic nature of LpA. Currently no direct drug Rx. Will forward her name to Dr Mona as some trials starting. Given very high LpA, LDL 131 and low apo B with start crestor  5 mg daily at night and repeat standard labs in 3 months.   Crestor  5 mg Lipid/Liver 3 months  Send to to Dr Mona regarding LpA trials   Signed: Maude Emmer 03/31/2024, 11:34 AM        [1]  Current Outpatient Medications:    Ascorbic Acid (VITAMIN C) 1000 MG tablet, Take 1,000 mg by mouth daily., Disp: , Rfl:    B Complex Vitamins (VITAMIN B COMPLEX 100 IJ), Take 1 capsule by mouth daily., Disp: , Rfl:    cetirizine (ZYRTEC) 10 MG tablet, Take 10 mg by mouth as  needed for allergies., Disp: , Rfl:    Cholecalciferol (VITAMIN D3) 10 MCG (400 UNIT) tablet, Take 4,000 Units by mouth daily., Disp: , Rfl:    Glucosamine 500 MG CAPS, Take 500 mg by mouth daily., Disp: , Rfl:    meloxicam  (MOBIC ) 7.5 MG tablet, Take 7.5 mg by mouth daily., Disp: , Rfl:    Omega-3-6-9 CAPS, Take 2 capsules by mouth daily., Disp: , Rfl:    Zinc 10 MG LOZG, Take 1 lozenge by mouth daily. (Patient taking differently: Take 1 lozenge by mouth as needed (for allergies).), Disp: , Rfl:    rosuvastatin  (CRESTOR ) 5 MG tablet, Take 1 tablet (5 mg total) by mouth daily., Disp: 90 tablet, Rfl: 3

## 2024-03-31 NOTE — Patient Instructions (Addendum)
 Medication Instructions:  Your physician recommends that you continue on your current medications as directed. Please refer to the Current Medication list given to you today.  *If you need a refill on your cardiac medications before your next appointment, please call your pharmacy*  Lab Work: Lipid Profile, Hepatic Function Test in 3 weeks If you have labs (blood work) drawn today and your tests are completely normal, you will receive your results only by: MyChart Message (if you have MyChart) OR A paper copy in the mail If you have any lab test that is abnormal or we need to change your treatment, we will call you to review the results.  Testing/Procedures: Calcium  Scoring CT  Follow-Up: At [facility], you and your health needs are our priority.  As part of our continuing mission to provide you with exceptional heart care, our providers are all part of one team.  This team includes your primary Cardiologist (physician) and Advanced Practice Providers or APPs (Physician Assistants and Nurse Practitioners) who all work together to provide you with the care you need, when you need it.  Your next appointment:   3 month(s)  Provider:   Maude Emmer, MD    We recommend signing up for the patient portal called MyChart.  Sign up information is provided on this After Visit Summary.  MyChart is used to connect with patients for Virtual Visits (Telemedicine).  Patients are able to view lab/test results, encounter notes, upcoming appointments, etc.  Non-urgent messages can be sent to your provider as well.   To learn more about what you can do with MyChart, go to [email or web address].

## 2024-04-04 ENCOUNTER — Encounter: Payer: Self-pay | Admitting: Cardiovascular Disease

## 2024-04-04 ENCOUNTER — Ambulatory Visit: Admitting: Sports Medicine

## 2024-04-04 DIAGNOSIS — E782 Mixed hyperlipidemia: Secondary | ICD-10-CM

## 2024-04-04 DIAGNOSIS — I2510 Atherosclerotic heart disease of native coronary artery without angina pectoris: Secondary | ICD-10-CM

## 2024-04-04 NOTE — Telephone Encounter (Signed)
 Per Dr Delford  written order  LPa has been placed in [facility] system.

## 2024-04-13 ENCOUNTER — Ambulatory Visit: Admitting: Sports Medicine

## 2024-04-17 ENCOUNTER — Other Ambulatory Visit (HOSPITAL_COMMUNITY)

## 2024-05-01 ENCOUNTER — Ambulatory Visit: Admitting: Neurology

## 2024-06-28 ENCOUNTER — Ambulatory Visit: Admitting: Cardiovascular Disease
